# Patient Record
Sex: Male | Born: 1945 | Race: White | Hispanic: No | Marital: Married | Smoking: Former smoker
Health system: Southern US, Community
[De-identification: ages and names within clinical notes are randomized; demographics above are authoritative.]

## PROBLEM LIST (undated history)

## (undated) DIAGNOSIS — E785 Hyperlipidemia, unspecified: Secondary | ICD-10-CM

## (undated) DIAGNOSIS — J189 Pneumonia, unspecified organism: Secondary | ICD-10-CM

## (undated) DIAGNOSIS — I10 Essential (primary) hypertension: Secondary | ICD-10-CM

## (undated) DIAGNOSIS — M4722 Other spondylosis with radiculopathy, cervical region: Secondary | ICD-10-CM

## (undated) DIAGNOSIS — J3089 Other allergic rhinitis: Secondary | ICD-10-CM

## (undated) DIAGNOSIS — M1990 Unspecified osteoarthritis, unspecified site: Secondary | ICD-10-CM

## (undated) DIAGNOSIS — E119 Type 2 diabetes mellitus without complications: Secondary | ICD-10-CM

## (undated) DIAGNOSIS — Z87442 Personal history of urinary calculi: Secondary | ICD-10-CM

## (undated) DIAGNOSIS — I779 Disorder of arteries and arterioles, unspecified: Secondary | ICD-10-CM

## (undated) HISTORY — PX: KNEE ARTHROSCOPY: SHX127

## (undated) HISTORY — PX: CATARACT EXTRACTION: SUR2

## (undated) HISTORY — PX: EYE SURGERY: SHX253

## (undated) HISTORY — PX: COLONOSCOPY: SHX174

## (undated) HISTORY — PX: JOINT REPLACEMENT: SHX530

---

## 2011-07-29 ENCOUNTER — Ambulatory Visit: Payer: Self-pay | Admitting: Internal Medicine

## 2015-01-02 DIAGNOSIS — M25561 Pain in right knee: Secondary | ICD-10-CM | POA: Diagnosis not present

## 2015-01-02 DIAGNOSIS — M2391 Unspecified internal derangement of right knee: Secondary | ICD-10-CM | POA: Diagnosis not present

## 2015-02-25 ENCOUNTER — Ambulatory Visit: Payer: Self-pay | Admitting: General Practice

## 2015-02-25 DIAGNOSIS — M949 Disorder of cartilage, unspecified: Secondary | ICD-10-CM | POA: Diagnosis not present

## 2015-02-25 DIAGNOSIS — M238X1 Other internal derangements of right knee: Secondary | ICD-10-CM | POA: Diagnosis not present

## 2015-02-25 DIAGNOSIS — S83241A Other tear of medial meniscus, current injury, right knee, initial encounter: Secondary | ICD-10-CM | POA: Diagnosis not present

## 2015-02-26 DIAGNOSIS — M25512 Pain in left shoulder: Secondary | ICD-10-CM | POA: Diagnosis not present

## 2015-02-26 DIAGNOSIS — M75122 Complete rotator cuff tear or rupture of left shoulder, not specified as traumatic: Secondary | ICD-10-CM | POA: Diagnosis not present

## 2015-02-26 DIAGNOSIS — M65812 Other synovitis and tenosynovitis, left shoulder: Secondary | ICD-10-CM | POA: Diagnosis not present

## 2015-02-26 DIAGNOSIS — M542 Cervicalgia: Secondary | ICD-10-CM | POA: Diagnosis not present

## 2015-03-09 DIAGNOSIS — S83271A Complex tear of lateral meniscus, current injury, right knee, initial encounter: Secondary | ICD-10-CM | POA: Diagnosis not present

## 2015-03-09 DIAGNOSIS — S161XXA Strain of muscle, fascia and tendon at neck level, initial encounter: Secondary | ICD-10-CM | POA: Diagnosis not present

## 2015-03-09 DIAGNOSIS — S83231A Complex tear of medial meniscus, current injury, right knee, initial encounter: Secondary | ICD-10-CM | POA: Diagnosis not present

## 2015-03-09 DIAGNOSIS — M47812 Spondylosis without myelopathy or radiculopathy, cervical region: Secondary | ICD-10-CM | POA: Diagnosis not present

## 2015-04-15 ENCOUNTER — Ambulatory Visit
Admit: 2015-04-15 | Disposition: A | Discharge status: Home / Self Care | Payer: Self-pay | Attending: Surgery | Admitting: Surgery

## 2015-04-15 DIAGNOSIS — M25561 Pain in right knee: Secondary | ICD-10-CM | POA: Diagnosis not present

## 2015-04-15 DIAGNOSIS — M479 Spondylosis, unspecified: Secondary | ICD-10-CM | POA: Diagnosis not present

## 2015-04-15 DIAGNOSIS — E785 Hyperlipidemia, unspecified: Secondary | ICD-10-CM | POA: Diagnosis not present

## 2015-04-15 DIAGNOSIS — Z882 Allergy status to sulfonamides status: Secondary | ICD-10-CM | POA: Diagnosis not present

## 2015-04-15 DIAGNOSIS — Z7982 Long term (current) use of aspirin: Secondary | ICD-10-CM | POA: Diagnosis not present

## 2015-04-15 DIAGNOSIS — E119 Type 2 diabetes mellitus without complications: Secondary | ICD-10-CM | POA: Diagnosis not present

## 2015-04-15 DIAGNOSIS — Z79899 Other long term (current) drug therapy: Secondary | ICD-10-CM | POA: Diagnosis not present

## 2015-04-15 DIAGNOSIS — M23303 Other meniscus derangements, unspecified medial meniscus, right knee: Secondary | ICD-10-CM | POA: Diagnosis not present

## 2015-04-15 DIAGNOSIS — I10 Essential (primary) hypertension: Secondary | ICD-10-CM | POA: Diagnosis not present

## 2015-04-15 DIAGNOSIS — Z87891 Personal history of nicotine dependence: Secondary | ICD-10-CM | POA: Diagnosis not present

## 2015-04-15 DIAGNOSIS — M1711 Unilateral primary osteoarthritis, right knee: Secondary | ICD-10-CM | POA: Diagnosis not present

## 2015-04-15 DIAGNOSIS — M23203 Derangement of unspecified medial meniscus due to old tear or injury, right knee: Secondary | ICD-10-CM | POA: Diagnosis not present

## 2015-04-24 DIAGNOSIS — E782 Mixed hyperlipidemia: Secondary | ICD-10-CM | POA: Diagnosis not present

## 2015-04-24 DIAGNOSIS — R030 Elevated blood-pressure reading, without diagnosis of hypertension: Secondary | ICD-10-CM | POA: Diagnosis not present

## 2015-04-24 DIAGNOSIS — E119 Type 2 diabetes mellitus without complications: Secondary | ICD-10-CM | POA: Diagnosis not present

## 2015-04-29 DIAGNOSIS — E119 Type 2 diabetes mellitus without complications: Secondary | ICD-10-CM | POA: Diagnosis not present

## 2015-04-29 DIAGNOSIS — E785 Hyperlipidemia, unspecified: Secondary | ICD-10-CM | POA: Diagnosis not present

## 2015-04-29 DIAGNOSIS — E1169 Type 2 diabetes mellitus with other specified complication: Secondary | ICD-10-CM | POA: Diagnosis not present

## 2015-04-29 DIAGNOSIS — I10 Essential (primary) hypertension: Secondary | ICD-10-CM | POA: Diagnosis not present

## 2015-09-07 DIAGNOSIS — M7051 Other bursitis of knee, right knee: Secondary | ICD-10-CM | POA: Diagnosis not present

## 2015-09-07 DIAGNOSIS — M1711 Unilateral primary osteoarthritis, right knee: Secondary | ICD-10-CM | POA: Diagnosis not present

## 2015-10-30 DIAGNOSIS — E1169 Type 2 diabetes mellitus with other specified complication: Secondary | ICD-10-CM | POA: Diagnosis not present

## 2015-10-30 DIAGNOSIS — E785 Hyperlipidemia, unspecified: Secondary | ICD-10-CM | POA: Diagnosis not present

## 2015-10-30 DIAGNOSIS — E119 Type 2 diabetes mellitus without complications: Secondary | ICD-10-CM | POA: Diagnosis not present

## 2015-11-03 DIAGNOSIS — E1169 Type 2 diabetes mellitus with other specified complication: Secondary | ICD-10-CM | POA: Diagnosis not present

## 2015-11-03 DIAGNOSIS — E119 Type 2 diabetes mellitus without complications: Secondary | ICD-10-CM | POA: Diagnosis not present

## 2015-11-03 DIAGNOSIS — Z0000 Encounter for general adult medical examination without abnormal findings: Secondary | ICD-10-CM | POA: Diagnosis not present

## 2015-11-03 DIAGNOSIS — E785 Hyperlipidemia, unspecified: Secondary | ICD-10-CM | POA: Diagnosis not present

## 2015-11-03 DIAGNOSIS — I10 Essential (primary) hypertension: Secondary | ICD-10-CM | POA: Diagnosis not present

## 2015-12-24 DIAGNOSIS — E119 Type 2 diabetes mellitus without complications: Secondary | ICD-10-CM | POA: Diagnosis not present

## 2016-04-29 DIAGNOSIS — Z0000 Encounter for general adult medical examination without abnormal findings: Secondary | ICD-10-CM | POA: Diagnosis not present

## 2016-04-29 DIAGNOSIS — E119 Type 2 diabetes mellitus without complications: Secondary | ICD-10-CM | POA: Diagnosis not present

## 2016-04-29 DIAGNOSIS — E785 Hyperlipidemia, unspecified: Secondary | ICD-10-CM | POA: Diagnosis not present

## 2016-04-29 DIAGNOSIS — E1169 Type 2 diabetes mellitus with other specified complication: Secondary | ICD-10-CM | POA: Diagnosis not present

## 2016-05-06 DIAGNOSIS — E119 Type 2 diabetes mellitus without complications: Secondary | ICD-10-CM | POA: Diagnosis not present

## 2016-05-06 DIAGNOSIS — I779 Disorder of arteries and arterioles, unspecified: Secondary | ICD-10-CM | POA: Diagnosis not present

## 2016-05-06 DIAGNOSIS — E1169 Type 2 diabetes mellitus with other specified complication: Secondary | ICD-10-CM | POA: Diagnosis not present

## 2016-05-06 DIAGNOSIS — E785 Hyperlipidemia, unspecified: Secondary | ICD-10-CM | POA: Diagnosis not present

## 2016-05-06 DIAGNOSIS — I10 Essential (primary) hypertension: Secondary | ICD-10-CM | POA: Diagnosis not present

## 2016-05-13 DIAGNOSIS — M1711 Unilateral primary osteoarthritis, right knee: Secondary | ICD-10-CM | POA: Diagnosis not present

## 2016-05-13 DIAGNOSIS — S83271D Complex tear of lateral meniscus, current injury, right knee, subsequent encounter: Secondary | ICD-10-CM | POA: Diagnosis not present

## 2016-05-13 DIAGNOSIS — S83231D Complex tear of medial meniscus, current injury, right knee, subsequent encounter: Secondary | ICD-10-CM | POA: Diagnosis not present

## 2016-08-19 DIAGNOSIS — G8929 Other chronic pain: Secondary | ICD-10-CM | POA: Diagnosis not present

## 2016-08-19 DIAGNOSIS — M1711 Unilateral primary osteoarthritis, right knee: Secondary | ICD-10-CM | POA: Diagnosis not present

## 2016-08-19 DIAGNOSIS — M25561 Pain in right knee: Secondary | ICD-10-CM | POA: Diagnosis not present

## 2016-08-26 DIAGNOSIS — M1711 Unilateral primary osteoarthritis, right knee: Secondary | ICD-10-CM | POA: Diagnosis not present

## 2016-08-26 DIAGNOSIS — G8929 Other chronic pain: Secondary | ICD-10-CM | POA: Diagnosis not present

## 2016-08-26 DIAGNOSIS — M25561 Pain in right knee: Secondary | ICD-10-CM | POA: Diagnosis not present

## 2016-09-02 DIAGNOSIS — M1711 Unilateral primary osteoarthritis, right knee: Secondary | ICD-10-CM | POA: Diagnosis not present

## 2016-10-24 DIAGNOSIS — G8929 Other chronic pain: Secondary | ICD-10-CM | POA: Diagnosis not present

## 2016-10-24 DIAGNOSIS — M1711 Unilateral primary osteoarthritis, right knee: Secondary | ICD-10-CM | POA: Diagnosis not present

## 2016-10-24 DIAGNOSIS — M25561 Pain in right knee: Secondary | ICD-10-CM | POA: Diagnosis not present

## 2016-11-08 DIAGNOSIS — E1169 Type 2 diabetes mellitus with other specified complication: Secondary | ICD-10-CM | POA: Diagnosis not present

## 2016-11-08 DIAGNOSIS — E119 Type 2 diabetes mellitus without complications: Secondary | ICD-10-CM | POA: Diagnosis not present

## 2016-11-08 DIAGNOSIS — E785 Hyperlipidemia, unspecified: Secondary | ICD-10-CM | POA: Diagnosis not present

## 2016-11-14 DIAGNOSIS — Z0000 Encounter for general adult medical examination without abnormal findings: Secondary | ICD-10-CM | POA: Diagnosis not present

## 2016-11-14 DIAGNOSIS — E785 Hyperlipidemia, unspecified: Secondary | ICD-10-CM | POA: Diagnosis not present

## 2016-11-14 DIAGNOSIS — E119 Type 2 diabetes mellitus without complications: Secondary | ICD-10-CM | POA: Diagnosis not present

## 2016-11-14 DIAGNOSIS — I10 Essential (primary) hypertension: Secondary | ICD-10-CM | POA: Diagnosis not present

## 2016-11-14 DIAGNOSIS — I779 Disorder of arteries and arterioles, unspecified: Secondary | ICD-10-CM | POA: Diagnosis not present

## 2016-11-14 DIAGNOSIS — E1169 Type 2 diabetes mellitus with other specified complication: Secondary | ICD-10-CM | POA: Diagnosis not present

## 2016-12-14 ENCOUNTER — Other Ambulatory Visit: Payer: Self-pay | Admitting: Surgery

## 2016-12-14 DIAGNOSIS — S161XXA Strain of muscle, fascia and tendon at neck level, initial encounter: Secondary | ICD-10-CM | POA: Diagnosis not present

## 2016-12-14 DIAGNOSIS — M4802 Spinal stenosis, cervical region: Secondary | ICD-10-CM

## 2016-12-14 DIAGNOSIS — M47812 Spondylosis without myelopathy or radiculopathy, cervical region: Secondary | ICD-10-CM

## 2016-12-14 DIAGNOSIS — M25511 Pain in right shoulder: Secondary | ICD-10-CM | POA: Diagnosis not present

## 2016-12-15 ENCOUNTER — Ambulatory Visit
Admission: RE | Admit: 2016-12-15 | Discharge: 2016-12-15 | Disposition: A | Discharge status: Home / Self Care | Payer: Commercial Managed Care - HMO | Source: Ambulatory Visit | Attending: Surgery | Admitting: Surgery

## 2016-12-15 DIAGNOSIS — M47812 Spondylosis without myelopathy or radiculopathy, cervical region: Secondary | ICD-10-CM | POA: Diagnosis not present

## 2016-12-15 DIAGNOSIS — X58XXXA Exposure to other specified factors, initial encounter: Secondary | ICD-10-CM | POA: Diagnosis not present

## 2016-12-15 DIAGNOSIS — S161XXA Strain of muscle, fascia and tendon at neck level, initial encounter: Secondary | ICD-10-CM | POA: Diagnosis not present

## 2016-12-15 DIAGNOSIS — M4802 Spinal stenosis, cervical region: Secondary | ICD-10-CM

## 2016-12-15 DIAGNOSIS — M1288 Other specific arthropathies, not elsewhere classified, other specified site: Secondary | ICD-10-CM | POA: Insufficient documentation

## 2016-12-15 DIAGNOSIS — M5126 Other intervertebral disc displacement, lumbar region: Secondary | ICD-10-CM | POA: Diagnosis not present

## 2018-01-31 ENCOUNTER — Ambulatory Visit
Admission: RE | Admit: 2018-01-31 | Discharge: 2018-01-31 | Disposition: A | Discharge status: Home / Self Care | Payer: Medicare PPO | Source: Ambulatory Visit | Attending: Surgery | Admitting: Surgery

## 2018-01-31 ENCOUNTER — Encounter
Admission: RE | Admit: 2018-01-31 | Discharge: 2018-01-31 | Disposition: A | Discharge status: Home / Self Care | Payer: Medicare PPO | Source: Ambulatory Visit | Attending: Surgery | Admitting: Surgery

## 2018-01-31 ENCOUNTER — Other Ambulatory Visit: Payer: Self-pay

## 2018-01-31 DIAGNOSIS — I10 Essential (primary) hypertension: Secondary | ICD-10-CM | POA: Diagnosis not present

## 2018-01-31 DIAGNOSIS — E119 Type 2 diabetes mellitus without complications: Secondary | ICD-10-CM | POA: Diagnosis not present

## 2018-01-31 DIAGNOSIS — Z01818 Encounter for other preprocedural examination: Secondary | ICD-10-CM

## 2018-01-31 DIAGNOSIS — Z01812 Encounter for preprocedural laboratory examination: Secondary | ICD-10-CM | POA: Diagnosis present

## 2018-01-31 HISTORY — DX: Hyperlipidemia, unspecified: E78.5

## 2018-01-31 HISTORY — DX: Unspecified osteoarthritis, unspecified site: M19.90

## 2018-01-31 HISTORY — DX: Type 2 diabetes mellitus without complications: E11.9

## 2018-01-31 HISTORY — DX: Essential (primary) hypertension: I10

## 2018-01-31 HISTORY — DX: Other allergic rhinitis: J30.89

## 2018-01-31 LAB — URINALYSIS, COMPLETE (UACMP) WITH MICROSCOPIC
Bilirubin Urine: NEGATIVE
GLUCOSE, UA: NEGATIVE mg/dL
HGB URINE DIPSTICK: NEGATIVE
Ketones, ur: NEGATIVE mg/dL
NITRITE: POSITIVE — AB
PH: 5 (ref 5.0–8.0)
Protein, ur: NEGATIVE mg/dL
Specific Gravity, Urine: 1.019 (ref 1.005–1.030)
Squamous Epithelial / LPF: NONE SEEN

## 2018-01-31 LAB — BASIC METABOLIC PANEL
Anion gap: 10 (ref 5–15)
BUN: 18 mg/dL (ref 6–20)
CHLORIDE: 104 mmol/L (ref 101–111)
CO2: 24 mmol/L (ref 22–32)
Calcium: 9.5 mg/dL (ref 8.9–10.3)
Creatinine, Ser: 0.95 mg/dL (ref 0.61–1.24)
GFR calc Af Amer: >60 mL/min (ref >60)
GFR calc non Af Amer: >60 mL/min (ref >60)
GLUCOSE: 123 mg/dL — AB (ref 65–99)
POTASSIUM: 4.2 mmol/L (ref 3.5–5.1)
Sodium: 138 mmol/L (ref 135–145)

## 2018-01-31 LAB — PROTIME-INR
INR: 1.04
PROTHROMBIN TIME: 13.5 s (ref 11.4–15.2)

## 2018-01-31 LAB — SURGICAL PCR SCREEN
MRSA, PCR: NEGATIVE
Staphylococcus aureus: POSITIVE — AB

## 2018-01-31 LAB — TYPE AND SCREEN
ABO/RH(D): O NEG
ANTIBODY SCREEN: NEGATIVE

## 2018-01-31 LAB — CBC
HCT: 42.9 % (ref 40.0–52.0)
HEMOGLOBIN: 14.5 g/dL (ref 13.0–18.0)
MCH: 30.9 pg (ref 26.0–34.0)
MCHC: 33.9 g/dL (ref 32.0–36.0)
MCV: 91.1 fL (ref 80.0–100.0)
Platelets: 266 10*3/uL (ref 150–440)
RBC: 4.71 MIL/uL (ref 4.40–5.90)
RDW: 13.4 % (ref 11.5–14.5)
WBC: 7.3 10*3/uL (ref 3.8–10.6)

## 2018-01-31 NOTE — Patient Instructions (Signed)
Your procedure is scheduled on: 02/08/18 Thurs Report to Same Day Surgery 2nd floor medical mall [facility] Entrance-take elevator on left to 2nd floor.  Check in with surgery information desk.) To find out your arrival time please call [phone] between 1PM - 3PM on 02/07/18 Wed  Remember: Instructions that are not followed completely may result in serious medical risk, up to and including death, or upon the discretion of your surgeon and anesthesiologist your surgery may need to be rescheduled.    _x___ 1. Do not eat food after midnight the night before your procedure. You may drink clear liquids up to 2 hours before you are scheduled to arrive at the hospital for your procedure.  Do not drink clear liquids within 2 hours of your scheduled arrival to the hospital.  Clear liquids include  --Water or Apple juice without pulp  --Clear carbohydrate beverage such as ClearFast or Gatorade  --Black Coffee or Clear Tea (No milk, no creamers, do not add anything to                  the coffee or Tea Type 1 and type 2 diabetics should only drink water.  No gum chewing or hard candies.     __x__ 2. No Alcohol for 24 hours before or after surgery.   __x__3. No Smoking or e-cigarettes for 24 prior to surgery.  Do not use any chewable tobacco products for at least 6 hour prior to surgery   ____  4. Bring all medications with you on the day of surgery if instructed.    __x__ 5. Notify your doctor if there is any change in your medical condition     (cold, fever, infections).    x___6. On the morning of surgery brush your teeth with toothpaste and water.  You may rinse your mouth with mouth wash if you wish.  Do not swallow any toothpaste or mouthwash.   Do not wear jewelry, make-up, hairpins, clips or nail polish.  Do not wear lotions, powders, or perfumes. You may wear deodorant.  Do not shave 48 hours prior to surgery. Men may shave face and neck.  Do not bring valuables to the hospital.     [facility] is not responsible for any belongings or valuables.               Contacts, dentures or bridgework may not be worn into surgery.  Leave your suitcase in the car. After surgery it may be brought to your room.  For patients admitted to the hospital, discharge time is determined by your                       treatment team.  _  Patients discharged the day of surgery will not be allowed to drive home.  You will need someone to drive you home and stay with you the night of your procedure.    Please read over the following fact sheets that you were given:   [facility] Preparing for Surgery and or MRSA Information   _x___ Take anti-hypertensive listed below, cardiac, seizure, asthma,     anti-reflux and psychiatric medicines. These include:  1. fluticasone (FLONASE) 50 MCG/ACT nasal spray  2.  3.  4.  5.  6.  ____Fleets enema or Magnesium Citrate as directed.   _x___ Use CHG Soap or sage wipes as directed on instruction sheet   ____ Use inhalers on the day of surgery and bring to  hospital day of surgery  _x___ Stop Metformin and Janumet 2 days prior to surgery.    ____ Take 1/2 of usual insulin dose the night before surgery and none on the morning     surgery.   _x___ Follow recommendations from Cardiologist, Pulmonologist or PCP regarding          stopping Aspirin, Coumadin, Plavix ,Eliquis, Effient, or Pradaxa, and Pletal.  X____Stop Anti-inflammatories such as Advil, Aleve, Ibuprofen, Motrin, Naproxen, Naprosyn, Goodies powders or aspirin products. OK to take Tylenol and                          Celebrex.   _x___ Stop supplements until after surgery.  But may continue Vitamin D, Vitamin B,       and multivitamin.   ____ Bring C-Pap to the hospital.

## 2018-02-07 MED ORDER — CEFAZOLIN SODIUM-DEXTROSE 2-4 GM/100ML-% IV SOLN: 2.0000 g | Freq: Once | INTRAVENOUS | Status: DC

## 2018-02-08 ENCOUNTER — Inpatient Hospital Stay: Payer: Medicare PPO

## 2018-02-08 ENCOUNTER — Other Ambulatory Visit: Payer: Self-pay

## 2018-02-08 ENCOUNTER — Inpatient Hospital Stay
Admission: RE | Admit: 2018-02-08 | Discharge: 2018-02-09 | DRG: 470 | Disposition: A | Discharge status: Home Health | Payer: Medicare PPO | Source: Ambulatory Visit | Attending: Surgery | Admitting: Surgery

## 2018-02-08 ENCOUNTER — Encounter
Admission: RE | Disposition: A | Discharge status: Home Health | Payer: Self-pay | Source: Ambulatory Visit | Attending: Surgery

## 2018-02-08 ENCOUNTER — Inpatient Hospital Stay: Payer: Medicare PPO | Admitting: Anesthesiology

## 2018-02-08 DIAGNOSIS — M1711 Unilateral primary osteoarthritis, right knee: Secondary | ICD-10-CM | POA: Diagnosis present

## 2018-02-08 DIAGNOSIS — Z7984 Long term (current) use of oral hypoglycemic drugs: Secondary | ICD-10-CM | POA: Diagnosis not present

## 2018-02-08 DIAGNOSIS — Z96651 Presence of right artificial knee joint: Secondary | ICD-10-CM

## 2018-02-08 DIAGNOSIS — Z87891 Personal history of nicotine dependence: Secondary | ICD-10-CM | POA: Diagnosis not present

## 2018-02-08 DIAGNOSIS — E119 Type 2 diabetes mellitus without complications: Secondary | ICD-10-CM | POA: Diagnosis present

## 2018-02-08 DIAGNOSIS — I10 Essential (primary) hypertension: Secondary | ICD-10-CM | POA: Diagnosis present

## 2018-02-08 DIAGNOSIS — Z7982 Long term (current) use of aspirin: Secondary | ICD-10-CM | POA: Diagnosis not present

## 2018-02-08 HISTORY — PX: TOTAL KNEE ARTHROPLASTY: SHX125

## 2018-02-08 LAB — ABO/RH: ABO/RH(D): O NEG

## 2018-02-08 LAB — GLUCOSE, CAPILLARY
GLUCOSE-CAPILLARY: 91 mg/dL (ref 65–99)
GLUCOSE-CAPILLARY: 123 mg/dL — AB (ref 65–99)
Glucose-Capillary: 104 mg/dL — ABNORMAL HIGH (ref 65–99)

## 2018-02-08 SURGERY — ARTHROPLASTY, KNEE, TOTAL
Anesthesia: Spinal | Site: Knee | Laterality: Right | Wound class: Clean

## 2018-02-08 MED ORDER — SODIUM CHLORIDE 0.9 % IV SOLN
INTRAVENOUS | Status: DC
  Administered 2018-02-08: 10:00:00 via INTRAVENOUS

## 2018-02-08 MED ORDER — ACETAMINOPHEN 325 MG PO TABS: 650.0000 mg | ORAL_TABLET | ORAL | Status: DC | PRN

## 2018-02-08 MED ORDER — MORPHINE SULFATE (PF) 2 MG/ML IV SOLN: 2.0000 mg | INTRAVENOUS | Status: DC | PRN

## 2018-02-08 MED ORDER — ASPIRIN EC 81 MG PO TBEC
81.0000 mg | DELAYED_RELEASE_TABLET | Freq: Every day | ORAL | Status: DC
  Administered 2018-02-09: 81 mg via ORAL
  Filled 2018-02-08: qty 1

## 2018-02-08 MED ORDER — FAMOTIDINE 20 MG PO TABS
ORAL_TABLET | ORAL | Status: AC
  Filled 2018-02-08: qty 1

## 2018-02-08 MED ORDER — DIPHENHYDRAMINE HCL 12.5 MG/5ML PO ELIX: 12.5000 mg | ORAL_SOLUTION | ORAL | Status: DC | PRN

## 2018-02-08 MED ORDER — NEOMYCIN-POLYMYXIN B GU 40-200000 IR SOLN
Status: AC
  Filled 2018-02-08: qty 20

## 2018-02-08 MED ORDER — ACETAMINOPHEN 650 MG RE SUPP: 650.0000 mg | RECTAL | Status: DC | PRN

## 2018-02-08 MED ORDER — PROMETHAZINE HCL 25 MG/ML IJ SOLN: 6.2500 mg | INTRAMUSCULAR | Status: DC | PRN

## 2018-02-08 MED ORDER — FENTANYL CITRATE (PF) 100 MCG/2ML IJ SOLN
INTRAMUSCULAR | Status: AC
  Filled 2018-02-08: qty 2

## 2018-02-08 MED ORDER — CEFAZOLIN SODIUM-DEXTROSE 2-3 GM-%(50ML) IV SOLR
INTRAVENOUS | Status: DC | PRN
  Administered 2018-02-08: 2 g via INTRAVENOUS

## 2018-02-08 MED ORDER — FENTANYL CITRATE (PF) 100 MCG/2ML IJ SOLN
INTRAMUSCULAR | Status: DC | PRN
  Administered 2018-02-08 (×2): 50 ug via INTRAVENOUS

## 2018-02-08 MED ORDER — DOCUSATE SODIUM 100 MG PO CAPS
100.0000 mg | ORAL_CAPSULE | Freq: Two times a day (BID) | ORAL | Status: DC
  Administered 2018-02-08 – 2018-02-09 (×2): 100 mg via ORAL
  Filled 2018-02-08 (×2): qty 1

## 2018-02-08 MED ORDER — MAGNESIUM HYDROXIDE 400 MG/5ML PO SUSP: 30.0000 mL | Freq: Every day | ORAL | Status: DC | PRN

## 2018-02-08 MED ORDER — ACETAMINOPHEN 10 MG/ML IV SOLN
INTRAVENOUS | Status: DC | PRN
  Administered 2018-02-08: 1000 mg via INTRAVENOUS

## 2018-02-08 MED ORDER — GLYCOPYRROLATE 0.2 MG/ML IJ SOLN
INTRAMUSCULAR | Status: DC | PRN
  Administered 2018-02-08: 0.2 mg via INTRAVENOUS

## 2018-02-08 MED ORDER — FAMOTIDINE 20 MG PO TABS
20.0000 mg | ORAL_TABLET | Freq: Once | ORAL | Status: AC
  Administered 2018-02-08: 20 mg via ORAL

## 2018-02-08 MED ORDER — FLEET ENEMA 7-19 GM/118ML RE ENEM: 1.0000 | ENEMA | Freq: Once | RECTAL | Status: DC | PRN

## 2018-02-08 MED ORDER — ACETAMINOPHEN 500 MG PO TABS
1000.0000 mg | ORAL_TABLET | Freq: Four times a day (QID) | ORAL | Status: AC
  Administered 2018-02-08 – 2018-02-09 (×3): 1000 mg via ORAL
  Filled 2018-02-08 (×4): qty 2

## 2018-02-08 MED ORDER — LIDOCAINE HCL (PF) 2 % IJ SOLN
INTRAMUSCULAR | Status: AC
  Filled 2018-02-08: qty 10

## 2018-02-08 MED ORDER — NEOMYCIN-POLYMYXIN B GU 40-200000 IR SOLN
Status: DC | PRN
  Administered 2018-02-08: 14 mL

## 2018-02-08 MED ORDER — ONDANSETRON HCL 4 MG/2ML IJ SOLN
INTRAMUSCULAR | Status: AC
  Filled 2018-02-08: qty 2

## 2018-02-08 MED ORDER — SODIUM CHLORIDE 0.9 % IV SOLN
INTRAVENOUS | Status: DC | PRN
  Administered 2018-02-08: 60 mL

## 2018-02-08 MED ORDER — CEFAZOLIN SODIUM-DEXTROSE 2-4 GM/100ML-% IV SOLN
INTRAVENOUS | Status: AC
  Filled 2018-02-08: qty 100

## 2018-02-08 MED ORDER — KETOROLAC TROMETHAMINE 15 MG/ML IJ SOLN
INTRAMUSCULAR | Status: AC
Start: 2018-02-08 — End: 2018-02-09
  Filled 2018-02-08: qty 1

## 2018-02-08 MED ORDER — MIDAZOLAM HCL 2 MG/2ML IJ SOLN
INTRAMUSCULAR | Status: AC
  Filled 2018-02-08: qty 2

## 2018-02-08 MED ORDER — BUPIVACAINE-EPINEPHRINE (PF) 0.5% -1:200000 IJ SOLN
INTRAMUSCULAR | Status: AC
  Filled 2018-02-08: qty 30

## 2018-02-08 MED ORDER — PROPOFOL 500 MG/50ML IV EMUL
INTRAVENOUS | Status: AC
  Filled 2018-02-08: qty 50

## 2018-02-08 MED ORDER — ATORVASTATIN CALCIUM 20 MG PO TABS
80.0000 mg | ORAL_TABLET | Freq: Every day | ORAL | Status: DC
  Administered 2018-02-09: 80 mg via ORAL
  Filled 2018-02-08: qty 4

## 2018-02-08 MED ORDER — PHENYLEPHRINE HCL 1 % NA SOLN
1.0000 [drp] | Freq: Two times a day (BID) | NASAL | Status: DC | PRN
  Filled 2018-02-08: qty 15

## 2018-02-08 MED ORDER — OXYCODONE HCL 5 MG PO TABS: 10.0000 mg | ORAL_TABLET | ORAL | Status: DC | PRN

## 2018-02-08 MED ORDER — KETAMINE HCL 50 MG/ML IJ SOLN
INTRAMUSCULAR | Status: AC
  Filled 2018-02-08: qty 10

## 2018-02-08 MED ORDER — HYDROCODONE-ACETAMINOPHEN 7.5-325 MG PO TABS
1.0000 | ORAL_TABLET | Freq: Once | ORAL | Status: DC | PRN
  Filled 2018-02-08: qty 1

## 2018-02-08 MED ORDER — MIDAZOLAM HCL 5 MG/5ML IJ SOLN
INTRAMUSCULAR | Status: DC | PRN
  Administered 2018-02-08 (×4): 1 mg via INTRAVENOUS

## 2018-02-08 MED ORDER — POTASSIUM CHLORIDE IN NACL 20-0.9 MEQ/L-% IV SOLN
INTRAVENOUS | Status: DC
  Administered 2018-02-08: 16:00:00 via INTRAVENOUS
  Filled 2018-02-08 (×5): qty 1000

## 2018-02-08 MED ORDER — ONDANSETRON HCL 4 MG PO TABS: 4.0000 mg | ORAL_TABLET | Freq: Four times a day (QID) | ORAL | Status: DC | PRN

## 2018-02-08 MED ORDER — METOCLOPRAMIDE HCL 10 MG PO TABS: 5.0000 mg | ORAL_TABLET | Freq: Three times a day (TID) | ORAL | Status: DC | PRN

## 2018-02-08 MED ORDER — FAMOTIDINE 20 MG PO TABS
ORAL_TABLET | ORAL | Status: AC
  Administered 2018-02-08: 20 mg via ORAL
  Filled 2018-02-08: qty 1

## 2018-02-08 MED ORDER — METFORMIN HCL 500 MG PO TABS
500.0000 mg | ORAL_TABLET | Freq: Every day | ORAL | Status: DC
  Administered 2018-02-08: 500 mg via ORAL
  Filled 2018-02-08 (×2): qty 1

## 2018-02-08 MED ORDER — ENOXAPARIN SODIUM 40 MG/0.4ML ~~LOC~~ SOLN
40.0000 mg | SUBCUTANEOUS | Status: DC
  Administered 2018-02-09: 40 mg via SUBCUTANEOUS
  Filled 2018-02-08: qty 0.4

## 2018-02-08 MED ORDER — CEFAZOLIN SODIUM-DEXTROSE 2-4 GM/100ML-% IV SOLN
2.0000 g | Freq: Four times a day (QID) | INTRAVENOUS | Status: AC
  Administered 2018-02-09 (×2): 2 g via INTRAVENOUS
  Filled 2018-02-08 (×3): qty 100

## 2018-02-08 MED ORDER — SODIUM CHLORIDE 0.9 % IJ SOLN
INTRAMUSCULAR | Status: DC | PRN
  Administered 2018-02-08: 40 mL via INTRAVENOUS

## 2018-02-08 MED ORDER — BUPIVACAINE HCL (PF) 0.5 % IJ SOLN
INTRAMUSCULAR | Status: DC | PRN
  Administered 2018-02-08: 3 mL

## 2018-02-08 MED ORDER — KETOROLAC TROMETHAMINE 15 MG/ML IJ SOLN
7.5000 mg | Freq: Four times a day (QID) | INTRAMUSCULAR | Status: AC
  Administered 2018-02-08 – 2018-02-09 (×4): 7.5 mg via INTRAVENOUS
  Filled 2018-02-08 (×4): qty 1

## 2018-02-08 MED ORDER — MEPERIDINE HCL 50 MG/ML IJ SOLN: 6.2500 mg | INTRAMUSCULAR | Status: DC | PRN

## 2018-02-08 MED ORDER — BISACODYL 10 MG RE SUPP: 10.0000 mg | Freq: Every day | RECTAL | Status: DC | PRN

## 2018-02-08 MED ORDER — TRANEXAMIC ACID 1000 MG/10ML IV SOLN
INTRAVENOUS | Status: AC
  Filled 2018-02-08: qty 10

## 2018-02-08 MED ORDER — ADULT MULTIVITAMIN W/MINERALS CH
1.0000 | ORAL_TABLET | Freq: Every day | ORAL | Status: DC
  Administered 2018-02-09: 1 via ORAL
  Filled 2018-02-08: qty 1

## 2018-02-08 MED ORDER — LACTATED RINGERS IV SOLN
INTRAVENOUS | Status: DC | PRN
  Administered 2018-02-08 (×2): via INTRAVENOUS

## 2018-02-08 MED ORDER — ENALAPRIL MALEATE 10 MG PO TABS
10.0000 mg | ORAL_TABLET | Freq: Every day | ORAL | Status: DC
  Administered 2018-02-09: 10 mg via ORAL
  Filled 2018-02-08 (×2): qty 1

## 2018-02-08 MED ORDER — SODIUM CHLORIDE 0.9 % IJ SOLN
INTRAMUSCULAR | Status: AC
  Filled 2018-02-08: qty 50

## 2018-02-08 MED ORDER — FLUTICASONE PROPIONATE 50 MCG/ACT NA SUSP
1.0000 | Freq: Every day | NASAL | Status: DC | PRN
  Filled 2018-02-08: qty 16

## 2018-02-08 MED ORDER — ONDANSETRON HCL 4 MG/2ML IJ SOLN
INTRAMUSCULAR | Status: DC | PRN
  Administered 2018-02-08: 4 mg via INTRAVENOUS

## 2018-02-08 MED ORDER — ACETAMINOPHEN 10 MG/ML IV SOLN
INTRAVENOUS | Status: AC
  Filled 2018-02-08: qty 100

## 2018-02-08 MED ORDER — GLYCOPYRROLATE 0.2 MG/ML IJ SOLN
INTRAMUSCULAR | Status: AC
  Filled 2018-02-08: qty 1

## 2018-02-08 MED ORDER — KETAMINE HCL 10 MG/ML IJ SOLN
INTRAMUSCULAR | Status: DC | PRN
Start: 2018-02-08 — End: 2018-02-08
  Administered 2018-02-08 (×5): 10 mg via INTRAVENOUS

## 2018-02-08 MED ORDER — PANTOPRAZOLE SODIUM 40 MG PO TBEC
40.0000 mg | DELAYED_RELEASE_TABLET | Freq: Every day | ORAL | Status: DC
  Administered 2018-02-09: 40 mg via ORAL
  Filled 2018-02-08: qty 1

## 2018-02-08 MED ORDER — BUPIVACAINE-EPINEPHRINE (PF) 0.5% -1:200000 IJ SOLN
INTRAMUSCULAR | Status: DC | PRN
  Administered 2018-02-08: 30 mL via PERINEURAL

## 2018-02-08 MED ORDER — KETOROLAC TROMETHAMINE 15 MG/ML IJ SOLN
15.0000 mg | Freq: Once | INTRAMUSCULAR | Status: AC
  Administered 2018-02-08: 15 mg via INTRAVENOUS

## 2018-02-08 MED ORDER — METOCLOPRAMIDE HCL 5 MG/ML IJ SOLN: 5.0000 mg | Freq: Three times a day (TID) | INTRAMUSCULAR | Status: DC | PRN

## 2018-02-08 MED ORDER — OXYCODONE HCL 5 MG PO TABS: 5.0000 mg | ORAL_TABLET | ORAL | Status: DC | PRN

## 2018-02-08 MED ORDER — HYDROMORPHONE HCL 1 MG/ML IJ SOLN: 0.2500 mg | INTRAMUSCULAR | Status: DC | PRN

## 2018-02-08 MED ORDER — TRANEXAMIC ACID 1000 MG/10ML IV SOLN
INTRAVENOUS | Status: DC | PRN
  Administered 2018-02-08: 1000 mg via TOPICAL

## 2018-02-08 MED ORDER — BUPIVACAINE LIPOSOME 1.3 % IJ SUSP
INTRAMUSCULAR | Status: AC
  Filled 2018-02-08: qty 20

## 2018-02-08 MED ORDER — ONDANSETRON HCL 4 MG/2ML IJ SOLN: 4.0000 mg | Freq: Four times a day (QID) | INTRAMUSCULAR | Status: DC | PRN

## 2018-02-08 MED ORDER — PROPOFOL 500 MG/50ML IV EMUL
INTRAVENOUS | Status: DC | PRN
  Administered 2018-02-08: 100 ug/kg/min via INTRAVENOUS

## 2018-02-08 SURGICAL SUPPLY — 55 items
BANDAGE ELASTIC 6 LF NS (GAUZE/BANDAGES/DRESSINGS) ×3 IMPLANT
BLADE SAW SAG 25X90X1.19 (BLADE) ×3 IMPLANT
BLADE SURG SZ20 CARB STEEL (BLADE) ×3 IMPLANT
CANISTER SUCT 1200ML W/VALVE (MISCELLANEOUS) ×3 IMPLANT
CANISTER SUCT 3000ML PPV (MISCELLANEOUS) ×3 IMPLANT
CAPT KNEE TOTAL 3 ×3 IMPLANT
CEMENT BONE R 1X40 (Cement) ×66 IMPLANT
CEMENT VACUUM MIXING SYSTEM (MISCELLANEOUS) ×3 IMPLANT
CHLORAPREP W/TINT 26ML (MISCELLANEOUS) ×3 IMPLANT
COOLER POLAR GLACIER W/PUMP (MISCELLANEOUS) ×3 IMPLANT
COVER MAYO STAND STRL (DRAPES) ×3 IMPLANT
CUFF TOURN 24 STER (MISCELLANEOUS) IMPLANT
CUFF TOURN 30 STER DUAL PORT (MISCELLANEOUS) ×3 IMPLANT
DRAPE IMP U-DRAPE 54X76 (DRAPES) ×3 IMPLANT
DRAPE INCISE IOBAN 66X45 STRL (DRAPES) ×3 IMPLANT
DRAPE SHEET LG 3/4 BI-LAMINATE (DRAPES) ×3 IMPLANT
DRSG OPSITE POSTOP 4X12 (GAUZE/BANDAGES/DRESSINGS) IMPLANT
DRSG OPSITE POSTOP 4X14 (GAUZE/BANDAGES/DRESSINGS) IMPLANT
ELECT CAUTERY BLADE 6.4 (BLADE) ×3 IMPLANT
ELECT REM PT RETURN 9FT ADLT (ELECTROSURGICAL) ×3
ELECTRODE REM PT RTRN 9FT ADLT (ELECTROSURGICAL) ×1 IMPLANT
GLOVE BIO SURGEON STRL SZ7.5 (GLOVE) ×12 IMPLANT
GLOVE BIO SURGEON STRL SZ8 (GLOVE) ×12 IMPLANT
GLOVE BIOGEL PI IND STRL 8 (GLOVE) ×1 IMPLANT
GLOVE BIOGEL PI INDICATOR 8 (GLOVE) ×2
GLOVE INDICATOR 8.0 STRL GRN (GLOVE) ×3 IMPLANT
GOWN STRL REUS W/ TWL LRG LVL3 (GOWN DISPOSABLE) ×1 IMPLANT
GOWN STRL REUS W/ TWL XL LVL3 (GOWN DISPOSABLE) ×1 IMPLANT
GOWN STRL REUS W/TWL LRG LVL3 (GOWN DISPOSABLE) ×2
GOWN STRL REUS W/TWL XL LVL3 (GOWN DISPOSABLE) ×2
HOLDER FOLEY CATH W/STRAP (MISCELLANEOUS) ×3 IMPLANT
HOOD PEEL AWAY FLYTE STAYCOOL (MISCELLANEOUS) ×9 IMPLANT
IMMBOLIZER KNEE 19 BLUE UNIV (SOFTGOODS) ×3 IMPLANT
KIT TURNOVER KIT A (KITS) ×3 IMPLANT
NDL SAFETY ECLIPSE 18X1.5 (NEEDLE) ×2 IMPLANT
NEEDLE HYPO 18GX1.5 SHARP (NEEDLE) ×4
NEEDLE SPNL 20GX3.5 QUINCKE YW (NEEDLE) ×3 IMPLANT
NS IRRIG 1000ML POUR BTL (IV SOLUTION) ×3 IMPLANT
PACK TOTAL KNEE (MISCELLANEOUS) ×3 IMPLANT
PAD WRAPON POLAR KNEE (MISCELLANEOUS) ×1 IMPLANT
PULSAVAC PLUS IRRIG FAN TIP (DISPOSABLE) ×3
SOL .9 NS 3000ML IRR  AL (IV SOLUTION) ×2
SOL .9 NS 3000ML IRR UROMATIC (IV SOLUTION) ×1 IMPLANT
STAPLER SKIN PROX 35W (STAPLE) ×3 IMPLANT
SUCTION FRAZIER HANDLE 10FR (MISCELLANEOUS) ×2
SUCTION TUBE FRAZIER 10FR DISP (MISCELLANEOUS) ×1 IMPLANT
SUT VIC AB 0 CT1 36 (SUTURE) ×9 IMPLANT
SUT VIC AB 2-0 CT1 27 (SUTURE) ×6
SUT VIC AB 2-0 CT1 TAPERPNT 27 (SUTURE) ×3 IMPLANT
SYR 10ML LL (SYRINGE) ×3 IMPLANT
SYR 20CC LL (SYRINGE) ×3 IMPLANT
SYR 30ML LL (SYRINGE) ×9 IMPLANT
TIP FAN IRRIG PULSAVAC PLUS (DISPOSABLE) ×1 IMPLANT
TRAY FOLEY W/METER SILVER 16FR (SET/KITS/TRAYS/PACK) ×3 IMPLANT
WRAPON POLAR PAD KNEE (MISCELLANEOUS) ×3

## 2018-02-08 NOTE — Anesthesia Postprocedure Evaluation (Signed)
Anesthesia Post Note  Patient: Tyrone Bird  Procedure(s) Performed: TOTAL KNEE ARTHROPLASTY (Right Knee)  Patient location during evaluation: PACU Anesthesia Type: Spinal Level of consciousness: oriented and awake and alert Pain management: pain level controlled Vital Signs Assessment: post-procedure vital signs reviewed and stable Respiratory status: spontaneous breathing, respiratory function stable and patient connected to nasal cannula oxygen Cardiovascular status: blood pressure returned to baseline and stable Postop Assessment: no headache, no backache and no apparent nausea or vomiting Anesthetic complications: no     Last Vitals:  Vitals:   02/08/18 1321 02/08/18 1410  BP: 109/77 111/77  Pulse: 80 76  Resp: 18 20  Temp:  36.4 C  SpO2: 94% 100%    Last Pain:  Vitals:   02/08/18 1410  TempSrc: Oral  PainSc:                  Christia ReadingScott T Phillip Maffei

## 2018-02-08 NOTE — Transfer of Care (Signed)
Immediate Anesthesia Transfer of Care Note  Patient: Tyrone ParrotJimmy J Coward  Procedure(s) Performed: TOTAL KNEE ARTHROPLASTY (Right Knee)  Patient Location: PACU  Anesthesia Type:Spinal  Level of Consciousness: awake, alert  and oriented  Airway & Oxygen Therapy: Patient Spontanous Breathing and Patient connected to face mask oxygen  Post-op Assessment: Report given to RN and Post -op Vital signs reviewed and stable  Post vital signs: Reviewed and stable  Last Vitals:  Vitals:   02/08/18 0929  BP: (!) 173/89  Pulse: 84  Resp: 16  Temp: 36.4 C  SpO2: 99%    Last Pain:  Vitals:   02/08/18 0929  TempSrc: Temporal  PainSc: 6          Complications: No apparent anesthesia complications

## 2018-02-08 NOTE — Anesthesia Preprocedure Evaluation (Addendum)
Anesthesia Evaluation  Patient identified by MRN, date of birth, ID band Patient awake    Reviewed: Allergy & Precautions, H&P , NPO status , reviewed documented beta blocker date and time   Airway Mallampati: III  TM Distance: >3 FB     Dental  (+) Caps   Pulmonary former smoker,    Pulmonary exam normal        Cardiovascular hypertension, On Medications Normal cardiovascular exam     Neuro/Psych negative neurological ROS  negative psych ROS   GI/Hepatic Neg liver ROS, neg GERD  ,  Endo/Other  diabetes, Type 2  Renal/GU      Musculoskeletal  (+) Arthritis , Osteoarthritis,    Abdominal   Peds  Hematology negative hematology ROS (+)   Anesthesia Other Findings   Reproductive/Obstetrics                           Anesthesia Physical Anesthesia Plan  ASA: III  Anesthesia Plan: Spinal   Post-op Pain Management:    Induction:   PONV Risk Score and Plan: 2 and Propofol infusion  Airway Management Planned:   Additional Equipment:   Intra-op Plan:   Post-operative Plan:   Informed Consent: I have reviewed the patients History and Physical, chart, labs and discussed the procedure including the risks, benefits and alternatives for the proposed anesthesia with the patient or authorized representative who has indicated his/her understanding and acceptance.   Dental Advisory Given  Plan Discussed with: CRNA  Anesthesia Plan Comments:         Anesthesia Quick Evaluation

## 2018-02-08 NOTE — H&P (Signed)
Paper H&P to be scanned into permanent record. H&P reviewed and patient re-examined. No changes. 

## 2018-02-08 NOTE — Progress Notes (Signed)
Physical Therapy Evaluation Patient Details Name: Tyrone ParrotJimmy J Cinelli MRN: [id number] DOB: 1946-06-10 Today's Date: 02/08/2018   History of Present Illness  Pt underwent R TKR without reported post-op complications. He is POD#0 at time of initial PT evaluation  Clinical Impression  Pt admitted with above diagnosis. Pt currently with functional limitations due to the deficits listed below (see PT Problem List).  Pt is modified independent for bed mobility. CGA only for transfers and limited ambulation from bed to recliner. Pt is steady with use of rolling walker. AAROM is excellent for POD#0 at 0-112 degrees. Flexion limited by R knee dressings. Pt is able to complete all supine exercises as instructed. Pt will benefit from PT services to address deficits in strength, balance, and mobility in order to return to full function at home.     Follow Up Recommendations Home health PT    Equipment Recommendations  Rolling walker with 5" wheels;3in1 (PT)    Recommendations for Other Services       Precautions / Restrictions Precautions Precautions: Knee;Fall Precaution Booklet Issued: Yes (comment) Required Braces or Orthoses: Knee Immobilizer - Right Knee Immobilizer - Right: Discontinue once straight leg raise with < 10 degree lag Restrictions Weight Bearing Restrictions: Yes RLE Weight Bearing: Weight bearing as tolerated      Mobility  Bed Mobility Overal bed mobility: Modified Independent             General bed mobility comments: HOB elevated, use of bed rails, increased time  Transfers Overall transfer level: Needs assistance Equipment used: Rolling walker (2 wheeled) Transfers: Sit to/from Stand Sit to Stand: Min guard         General transfer comment: Cues for safe hand placement. Decreased weight shifting to RLE during transfer. Stable in standing with UE support on rolling walker  Ambulation/Gait Ambulation/Gait assistance: Min guard Ambulation Distance (Feet): 5  Feet Assistive device: Rolling walker (2 wheeled) Gait Pattern/deviations: Decreased step length - right;Decreased step length - left Gait velocity: Decreased Gait velocity interpretation: <1.8 ft/sec, indicative of risk for recurrent falls General Gait Details: Pt able to take short shuffling steps from bed to recliner. Decreased weight shift to RLE. Good stability in standing with bilateral UE support on rolling walker  Stairs            Wheelchair Mobility    Modified Rankin (Stroke Patients Only)       Balance Overall balance assessment: Needs assistance Sitting-balance support: No upper extremity supported Sitting balance-Leahy Scale: Good     Standing balance support: Bilateral upper extremity supported Standing balance-Leahy Scale: Fair Standing balance comment: Able to stabilize in standing with UE support on rolling walker                             Pertinent Vitals/Pain Pain Assessment: 0-10 Pain Score: 0-No pain Pain Location: Denies R knee pain at rest. Reports minimal increase after ambulation Pain Descriptors / Indicators: Operative site guarding Pain Intervention(s): Monitored during session    Home Living Family/patient expects to be discharged to:: Private residence Living Arrangements: Spouse/significant other(Son) Available Help at Discharge: Family Type of Home: House Home Access: Stairs to enter Entrance Stairs-Rails: Can reach both Entrance Stairs-Number of Steps: 3 Home Layout: Able to live on main level with bedroom/bathroom;Multi-level Home Equipment: Cane - single point;Shower seat;Other (comment)(Shower seat is in disrepair)      Prior Function Level of Independence: Independent with assistive device(s)  Comments: Independent with ADLs/IADLs. Ambulates with single point cane. 1 fall in the last 12 months. Drives     Hand Dominance   Dominant Hand: Right    Extremity/Trunk Assessment   Upper Extremity  Assessment Upper Extremity Assessment: Overall WFL for tasks assessed    Lower Extremity Assessment Lower Extremity Assessment: RLE deficits/detail RLE Deficits / Details: Able to perform SLR and SAQ without assist. Full DF/PF. Sensation intact to light touch. LLE strength WFL       Communication   Communication: No difficulties  Cognition Arousal/Alertness: Awake/alert Behavior During Therapy: WFL for tasks assessed/performed Overall Cognitive Status: Within Functional Limits for tasks assessed                                        General Comments      Exercises Total Joint Exercises Ankle Circles/Pumps: Both;10 reps Quad Sets: Both;10 reps Gluteal Sets: Both;10 reps Towel Squeeze: Both;10 reps Short Arc Quad: Right;10 reps Heel Slides: Right;10 reps Hip ABduction/ADduction: Right;10 reps Straight Leg Raises: Right;10 reps Goniometric ROM: 0-112 degrees AAROM, limited in flexion by dressings   Assessment/Plan    PT Assessment Patient needs continued PT services  PT Problem List Decreased strength;Decreased range of motion;Decreased balance;Decreased mobility;Pain       PT Treatment Interventions DME instruction;Gait training;Stair training;Therapeutic activities;Therapeutic exercise;Balance training;Neuromuscular re-education;Patient/family education;Manual techniques    PT Goals (Current goals can be found in the Care Plan section)  Acute Rehab PT Goals Patient Stated Goal: Return to prior function at home PT Goal Formulation: With patient Time For Goal Achievement: 02/22/18 Potential to Achieve Goals: Good    Frequency BID   Barriers to discharge        Co-evaluation               AM-PAC PT "6 Clicks" Daily Activity  Outcome Measure Difficulty turning over in bed (including adjusting bedclothes, sheets and blankets)?: A Little Difficulty moving from lying on back to sitting on the side of the bed? : A Little Difficulty sitting  down on and standing up from a chair with arms (e.g., wheelchair, bedside commode, etc,.)?: A Little Help needed moving to and from a bed to chair (including a wheelchair)?: A Little Help needed walking in hospital room?: A Little Help needed climbing 3-5 steps with a railing? : Total 6 Click Score: 16    End of Session Equipment Utilized During Treatment: Gait belt Activity Tolerance: Patient tolerated treatment well Patient left: in chair;with call bell/phone within reach;with chair alarm set;with SCD's reapplied;Other (comment)(towel roll under heel, polar care in place) Nurse Communication: Mobility status PT Visit Diagnosis: Unsteadiness on feet (R26.81);Muscle weakness (generalized) (M62.81);Pain Pain - Right/Left: Right Pain - part of body: Knee    Time: 1610-9604 PT Time Calculation (min) (ACUTE ONLY): 32 min   Charges:   PT Evaluation $PT Eval Low Complexity: 1 Low PT Treatments $Therapeutic Exercise: 8-22 mins   PT G Codes:        Sharalyn Ink Braleigh Massoud PT, DPT    Tequlia Gonsalves 02/08/2018, 4:15 PM

## 2018-02-08 NOTE — Op Note (Signed)
02/08/2018  1:05 PM  Patient:   Tyrone ParrotJimmy J Mckamey  Pre-Op Diagnosis:   Degenerative joint disease, right knee.  Post-Op Diagnosis:   Same  Procedure:   Right TKA using all-cemented [employer] Vanguard system with a 70 mm PCR femur, a 75 mm tibial tray with a 10 mm E-poly AS insert, and a 34 x 8.5 mm all-poly 3-pegged domed patella.  Surgeon:   Maryagnes AmosJ. Jeffrey Kimbella Heisler, MD  Assistant:   Horris LatinoLance McGhee, PA-C   Anesthesia:   Spinal  Findings:   As above  Complications:   None  EBL:   10 cc  Fluids:   1200 cc crystalloid  UOP:   250 cc  TT:   95 minutes at 300 mmHg  Drains:   None  Closure:   Staples  Implants:   As above  Brief Clinical Note:   The patient is a 72 year old male with a long history of progressively worsening right knee pain. The patient's symptoms have progressed despite medications, activity modification, injections, etc. The patient's history and examination were consistent with advanced degenerative joint disease of the right knee confirmed by plain radiographs. The patient presents at this time for a right total knee arthroplasty.  Procedure:   The patient was brought into the operating room. After adequate spinal anesthesia was obtained, the patient was lain in the supine position. A Foley catheter was placed by the nurse before the right lower extremity was prepped with ChloraPrep solution and draped sterilely. Preoperative antibiotics were administered. After verifying the proper laterality with a surgical timeout, the limb was exsanguinated with an Esmarch and the tourniquet inflated to 300 mmHg. A standard anterior approach to the knee was made through an approximately 7 inch incision. The incision was carried down through the subcutaneous tissues to expose superficial retinaculum. This was split the length of the incision and the medial flap elevated sufficiently to expose the medial retinaculum. The medial retinaculum was incised, leaving a 3-4 mm cuff of tissue on the  patella. This was extended distally along the medial border of the patellar tendon and proximally through the medial third of the quadriceps tendon. A subtotal fat pad excision was performed before the soft tissues were elevated off the anteromedial and anterolateral aspects of the proximal tibia to the level of the collateral ligaments. The anterior portions of the medial and lateral menisci were removed, as was the anterior cruciate ligament. With the knee flexed to 90, the external tibial guide was positioned and the appropriate proximal tibial cut made. This piece was taken to the back table where it was measured and found to be optimally replicated by a 75 mm component.  Attention was directed to the distal femur. The intramedullary canal was accessed through a 3/8" drill hole. The intramedullary guide was inserted and position in order to obtain a neutral flexion gap. The intercondylar block was positioned with care taken to avoid notching the anterior cortex of the femur. The appropriate cut was made. Next, the distal cutting block was placed at 6 of valgus alignment. Using the 9 mm slot, the distal cut was made. The distal femur was measured and found to be optimally replicated by the 70 mm component. The 70 mm 4-in-1 cutting block was positioned and first the posterior, then the posterior chamfer, the anterior chamfer, femoral and intercondylar cuts were made. At this point, the posterior portions medial and lateral menisci were removed. A trial reduction was performed using the appropriate femoral and tibial components with the 10  mm insert. This demonstrated excellent stability to varus and valgus stressing both in flexion and extension while permitting full extension. Patella tracking was assessed and found to be excellent. Therefore, the tibial guide position was marked on the proximal tibia. The patella thickness was measured and found to be 24 mm. Therefore, the appropriate cut was made. The  patellar surface was measured and found to be optimally replicated by the 34 mm component. The three peg holes were drilled in place before the trial button was inserted. Patella tracking was assessed and found to be excellent, passing the "no thumb test". The lug holes were drilled into the distal femur before the trial component was removed, leaving only the tibial tray. The keel was then created using the appropriate tower, reamer, and punch.  The bony surfaces were prepared for cementing by irrigating thoroughly with bacitracin saline solution. A bone plug was fashioned from some of the bone that had been removed previously and used to plug the distal femoral canal. In addition, 20 cc of Exparel diluted out to 60 cc with normal saline and 30 cc of 0.5% Sensorcaine were injected into the postero-medial and postero-lateral aspects of the knee, the medial and lateral gutter regions, and the peri-incisional tissues to help with postoperative analgesia. Meanwhile, the cement was being mixed on the back table. When it was ready, the tibial tray was cemented in first. The excess cement was removed using Personal assistant. Next, the femoral component was impacted into place. Again, the excess cement was removed using Personal assistant. The 10 mm trial insert was positioned and the knee brought into extension while the cement hardened. Finally, the patella was cemented into place and secured using the patellar clamp. Again, the excess cement was removed using Personal assistant. Once the cement had hardened, the knee was placed through a range of motion with the findings as described above. Therefore, the trial insert was removed and, after verifying that no cement had been retained posteriorly, the permanent insert was positioned and secured using the appropriate key locking mechanism. Again the knee was placed through a range of motion with the findings as described above.  The wound was copiously irrigated with  bacitracin saline solution using the jet lavage system before the quadriceps tendon and retinacular layer were reapproximated using #0 Vicryl interrupted sutures. The superficial retinacular layer also was closed using a running #0 Vicryl suture. A total of 10 cc of transexemic acid (TXA) was injected intra-articularly before the subcutaneous tissues were closed in several layers using 2-0 Vicryl interrupted sutures. The skin was closed using staples. A sterile honeycomb dressing was applied to the skin before the leg was wrapped with an Ace wrap to accommodate the polar pack. The patient was then awakened and returned to the recovery room in satisfactory condition after tolerating the procedure well.

## 2018-02-08 NOTE — Anesthesia Procedure Notes (Signed)
Spinal  Patient location during procedure: OR Preanesthetic Checklist Completed: patient identified, site marked, surgical consent, pre-op evaluation, timeout performed, IV checked, risks and benefits discussed and monitors and equipment checked Spinal Block Patient position: sitting Prep: Betadine Patient monitoring: heart rate, continuous pulse ox, blood pressure and cardiac monitor Approach: midline Location: L4-5 Injection technique: single-shot Needle Needle type: Whitacre and Introducer  Needle gauge: 24 G Needle length: 9 cm Additional Notes Negative paresthesia. Negative blood return. Positive free-flowing CSF. Expiration date of kit checked and confirmed. Patient tolerated procedure well, without complications.       

## 2018-02-08 NOTE — Anesthesia Post-op Follow-up Note (Signed)
Anesthesia QCDR form completed.        

## 2018-02-09 ENCOUNTER — Encounter: Payer: Self-pay | Admitting: Surgery

## 2018-02-09 LAB — CBC WITH DIFFERENTIAL/PLATELET
BASOS PCT: 0 %
Basophils Absolute: 0 10*3/uL (ref 0–0.1)
EOS ABS: 0.3 10*3/uL (ref 0–0.7)
EOS PCT: 4 %
HCT: 36.1 % — ABNORMAL LOW (ref 40.0–52.0)
HEMOGLOBIN: 12.3 g/dL — AB (ref 13.0–18.0)
LYMPHS ABS: 1.8 10*3/uL (ref 1.0–3.6)
Lymphocytes Relative: 20 %
MCH: 31.1 pg (ref 26.0–34.0)
MCHC: 33.9 g/dL (ref 32.0–36.0)
MCV: 91.7 fL (ref 80.0–100.0)
MONO ABS: 1.1 10*3/uL — AB (ref 0.2–1.0)
Monocytes Relative: 12 %
Neutro Abs: 5.9 10*3/uL (ref 1.4–6.5)
Neutrophils Relative %: 64 %
PLATELETS: 223 10*3/uL (ref 150–440)
RBC: 3.94 MIL/uL — ABNORMAL LOW (ref 4.40–5.90)
RDW: 13.1 % (ref 11.5–14.5)
WBC: 9.2 10*3/uL (ref 3.8–10.6)

## 2018-02-09 LAB — BASIC METABOLIC PANEL
Anion gap: 9 (ref 5–15)
BUN: 12 mg/dL (ref 6–20)
CALCIUM: 8.3 mg/dL — AB (ref 8.9–10.3)
CHLORIDE: 109 mmol/L (ref 101–111)
CO2: 19 mmol/L — ABNORMAL LOW (ref 22–32)
CREATININE: 0.89 mg/dL (ref 0.61–1.24)
GFR calc non Af Amer: >60 mL/min (ref >60)
Glucose, Bld: 113 mg/dL — ABNORMAL HIGH (ref 65–99)
Potassium: 4.6 mmol/L (ref 3.5–5.1)
SODIUM: 137 mmol/L (ref 135–145)

## 2018-02-09 LAB — GLUCOSE, CAPILLARY
GLUCOSE-CAPILLARY: 120 mg/dL — AB (ref 65–99)
Glucose-Capillary: 139 mg/dL — ABNORMAL HIGH (ref 65–99)

## 2018-02-09 MED ORDER — OXYCODONE HCL 5 MG PO TABS: 5.0000 mg | ORAL_TABLET | ORAL | 0 refills | Status: DC | PRN

## 2018-02-09 MED ORDER — TRAMADOL HCL 50 MG PO TABS: 50.0000 mg | ORAL_TABLET | Freq: Four times a day (QID) | ORAL | 0 refills | Status: DC | PRN

## 2018-02-09 MED ORDER — ENOXAPARIN SODIUM 40 MG/0.4ML ~~LOC~~ SOLN: 40.0000 mg | SUBCUTANEOUS | 0 refills | Status: DC

## 2018-02-09 MED ORDER — TRAMADOL HCL 50 MG PO TABS
50.0000 mg | ORAL_TABLET | Freq: Four times a day (QID) | ORAL | Status: DC | PRN
  Administered 2018-02-09: 100 mg via ORAL
  Filled 2018-02-09: qty 2

## 2018-02-09 NOTE — Care Management (Signed)
Patient would like to use [facility] for home health services. Referral to Dorene SorrowJerry with [facility] and that patient may discharge to home this PM.  Lovenox $100.54- patient agrees. Rolling [facility]

## 2018-02-09 NOTE — Progress Notes (Signed)
PT Cancellation Note  Patient Details Name: Tyrone Bird MRN: [id number] DOB: 11/02/1946   Cancelled Treatment:    Reason Eval/Treat Not Completed: Patient declined PT services this PM secondary to fatigue and wishing to let his leg rest.  Will attempt to see pt at a future date and time as medically appropriate.    Ovidio Hanger. Scott Evalyse Stroope PT, DPT 02/09/18, 2:46 PM

## 2018-02-09 NOTE — Progress Notes (Signed)
  Subjective: 1 Day Post-Op Procedure(s) (LRB): TOTAL KNEE ARTHROPLASTY (Right) Patient reports pain as 6 on 0-10 scale.   Patient is well, and has had no acute complaints or problems Plan is to go Home after hospital stay. Negative for chest pain and shortness of breath Fever: no Gastrointestinal:Negative for nausea and vomiting  Objective: Vital signs in last 24 hours: Temp:  [97.6 F (36.4 C)-98.4 F (36.9 C)] 97.8 F (36.6 C) (02/22 0307) Pulse Rate:  [76-89] 78 (02/22 0307) Resp:  [16-20] 19 (02/22 0047) BP: (109-173)/(71-89) 134/74 (02/22 0307) SpO2:  [94 %-100 %] 98 % (02/22 0307) FiO2 (%):  [28 %] 28 % (02/21 1410) Weight:  [112 kg (247 lb)] 112 kg (247 lb) (02/21 0929)  Intake/Output from previous day:  Intake/Output Summary (Last 24 hours) at 02/09/2018 0736 Last data filed at 02/09/2018 [id number] Gross per 24 hour  Intake 3026.67 ml  Output 410 ml  Net 2616.67 ml    Intake/Output this shift: No intake/output data recorded.  Labs: Recent Labs    02/09/18 0339  HGB 12.3*   Recent Labs    02/09/18 0339  WBC 9.2  RBC 3.94*  HCT 36.1*  PLT 223   Recent Labs    02/09/18 0339  NA 137  K 4.6  CL 109  CO2 19*  BUN 12  CREATININE 0.89  GLUCOSE 113*  CALCIUM 8.3*   No results for input(s): LABPT, INR in the last 72 hours.   EXAM General - Patient is Alert, Appropriate and Oriented Extremity - ABD soft Sensation intact distally Intact pulses distally Dorsiflexion/Plantar flexion intact Incision: moderate drainage No cellulitis present Dressing/Incision - blood tinged drainage Motor Function - intact, moving foot and toes well on exam.   Abdomen slightly distended this morning, normal bowel sounds with mild tympany.  Past Medical History:  Diagnosis Date  . Arthritis   . Diabetes mellitus without complication (HCC)   . Elevated lipids   . Environmental and seasonal allergies   . Hypertension     Assessment/Plan: 1 Day Post-Op Procedure(s)  (LRB): TOTAL KNEE ARTHROPLASTY (Right) Active Problems:   Status post total knee replacement using cement, right  Estimated body mass index is 36.48 kg/m as calculated from the following:   Height as of this encounter: 5\' 9"  (1.753 m).   Weight as of this encounter: 112 kg (247 lb). Advance diet Up with therapy D/C IV fluids when tolerating po intake.  Labs reviewed this am, Hg 12.3 PT recommending HHPT.   Begin working on bowel movement, gradual increase in oral intake due to abdomen distention. Plan will be for discharge home tomorrow.  DVT Prophylaxis - Lovenox, Foot Pumps and TED hose Weight-Bearing as tolerated to right leg  J. Horris LatinoLance Martez Weiand, PA-C [facility] 02/09/2018, 7:36 AM

## 2018-02-09 NOTE — Progress Notes (Signed)
Physical Therapy Treatment Patient Details Name: Tyrone Bird MRN: [id number] DOB: Nov 19, 1946 Today's Date: 02/09/2018    History of Present Illness Pt underwent R TKR without reported post-op complications.      PT Comments    Pt presents with minor deficits in strength, transfers, mobility, gait, balance, and activity tolerance but is progressing well towards goals.  Pt Mod Ind with bed mobility tasks and SBA with transfers.  Pt able to amb 200' with RW and SBA with good cadence and stability.  Pt steady ascending and descending 5 steps with B rails with good carry over regarding proper sequencing.  Pt did have some bleeding from the R knee with nursing called and left with pt changing bandages at the end of the session.  Pt will benefit from HHPT services upon discharge to safely address above deficits for decreased caregiver assistance and eventual return to PLOF.     Follow Up Recommendations  Home health PT     Equipment Recommendations  Rolling walker with 5" wheels;3in1 (PT)    Recommendations for Other Services       Precautions / Restrictions Precautions Precautions: Knee;Fall Required Braces or Orthoses: Other Brace/Splint Other Brace/Splint: Pt able to perform Ind RLE SLR x 10 without extensor lag, no KI required Restrictions Weight Bearing Restrictions: Yes RLE Weight Bearing: Weight bearing as tolerated    Mobility  Bed Mobility Overal bed mobility: Modified Independent             General bed mobility comments: HOB elevated, use of bed rails, increased time  Transfers Overall transfer level: Needs assistance Equipment used: Rolling walker (2 wheeled) Transfers: Sit to/from Stand Sit to Stand: Supervision         General transfer comment: Min verbal cues for sequencing during transfers with good eccentric and concentric control  Ambulation/Gait Ambulation/Gait assistance: Supervision Ambulation Distance (Feet): 200 Feet Assistive device: Rolling  walker (2 wheeled) Gait Pattern/deviations: Step-through pattern;Decreased step length - right;Decreased step length - left   Gait velocity interpretation: Below normal speed for age/gender General Gait Details: Good cadence with amb with no instability noted; min verbal cues for amb closer to RW provided   Stairs Stairs: Yes   Stair Management: Two rails Number of Stairs: 5 General stair comments: Min verbal and visual cues for sequencing with good carry over; pt steady ascending and descending stairs with B rails  Wheelchair Mobility    Modified Rankin (Stroke Patients Only)       Balance Overall balance assessment: Needs assistance Sitting-balance support: No upper extremity supported Sitting balance-Leahy Scale: Good     Standing balance support: Bilateral upper extremity supported Standing balance-Leahy Scale: Good                              Cognition Arousal/Alertness: Awake/alert Behavior During Therapy: WFL for tasks assessed/performed Overall Cognitive Status: Within Functional Limits for tasks assessed                                        Exercises Total Joint Exercises Ankle Circles/Pumps: AROM;Both;10 reps Quad Sets: Right;10 reps;15 reps Gluteal Sets: Strengthening;Both;10 reps Hip ABduction/ADduction: AROM;Both;10 reps Straight Leg Raises: AROM;Both;10 reps Long Arc Quad: AROM;Right;10 reps;15 reps Knee Flexion: AROM;Right;10 reps;15 reps Goniometric ROM: R knee AROM 0-98, limited in flexion by dressings Marching in Standing: AROM;Both;10 reps  General Comments        Pertinent Vitals/Pain Pain Assessment: 0-10 Pain Score: 2  Pain Location: R knee Pain Descriptors / Indicators: Sore Pain Intervention(s): Premedicated before session;Monitored during session;Limited activity within patient's tolerance    Home Living                      Prior Function            PT Goals (current goals can now  be found in the care plan section) Progress towards PT goals: Progressing toward goals    Frequency    BID      PT Plan Current plan remains appropriate    Co-evaluation              AM-PAC PT "6 Clicks" Daily Activity  Outcome Measure                   End of Session Equipment Utilized During Treatment: Gait belt Activity Tolerance: Patient tolerated treatment well Patient left: in bed;with bed alarm set;with nursing/sitter in room;with call bell/phone within reach;Other (comment)(Pt with some bleeding from R knee at end of session with nursing applying new dressing at end of PT session) Nurse Communication: Mobility status PT Visit Diagnosis: Unsteadiness on feet (R26.81);Muscle weakness (generalized) (M62.81);Pain Pain - Right/Left: Right Pain - part of body: Knee     Time: 1000-1030 PT Time Calculation (min) (ACUTE ONLY): 30 min  Charges:  $Gait Training: 8-22 mins $Therapeutic Exercise: 8-22 mins                    G Codes:       D. Scott Eduin Friedel PT, DPT 02/09/18, 1:15 PM

## 2018-02-09 NOTE — Discharge Instructions (Signed)

## 2018-02-09 NOTE — Discharge Summary (Signed)
Physician Discharge Summary  Patient ID: Tyrone Bird MRN: [id number] DOB/AGE: [birth year] 72 y.o.  Admit date: 02/08/2018 Discharge date: 02/09/2018  Admission Diagnoses:  PRIMARY OSTEOARTHRITIS OF RIGHT KNEE  Discharge Diagnoses: Patient Active Problem List   Diagnosis Date Noted  . Status post total knee replacement using cement, right 02/08/2018  Primary osteoarthritis of the right knee.  Past Medical History:  Diagnosis Date  . Arthritis   . Diabetes mellitus without complication (HCC)   . Elevated lipids   . Environmental and seasonal allergies   . Hypertension      Transfusion: None.   Consultants (if any):   Discharged Condition: Improved  Hospital Course: LARRELL RAPOZO is an 72 y.o. male who was admitted 02/08/2018 with a diagnosis of right knee osteoarthritis and went to the operating room on 02/08/2018 and underwent the above named procedures.    Surgeries: Procedure(s): TOTAL KNEE ARTHROPLASTY on 02/08/2018 Patient tolerated the surgery well. Taken to PACU where she was stabilized and then transferred to the orthopedic floor.  Started on Lovenox 40mg  q 24 hrs. Foot pumps applied bilaterally at 80 mm. Heels elevated on bed with rolled towels. No evidence of DVT. Negative Homan. Physical therapy started on day #1 for gait training and transfer. OT started day #1 for ADL and assisted devices.  Patient's IV was removed on POD1.  Foley removed following surgery.  Implants: Right TKA using all-cemented [employer] Vanguard system with a 70 mm PCR femur, a 75 mm tibial tray with a 10 mm E-poly AS insert, and a 34 x 8.5 mm all-poly 3-pegged domed patella.  He was given perioperative antibiotics:  Anti-infectives (From admission, onward)   Start     Dose/Rate Route Frequency Ordered Stop   02/08/18 1700  ceFAZolin (ANCEF) IVPB 2g/100 mL premix     2 g 200 mL/hr over 30 Minutes Intravenous Every 6 hours 02/08/18 1409 02/09/18 1059   02/08/18 0925  ceFAZolin (ANCEF) 2-4  GM/100ML-% IVPB    Comments:  Renie Ora   : cabinet override      02/08/18 0925 02/08/18 2144   02/08/18 0918  ceFAZolin (ANCEF) 2-4 GM/100ML-% IVPB    Comments:  Manfred Arch   : cabinet override      02/08/18 0918 02/08/18 2129   02/07/18 2215  ceFAZolin (ANCEF) IVPB 2g/100 mL premix  Status:  Discontinued     2 g 200 mL/hr over 30 Minutes Intravenous  Once 02/07/18 2211 02/08/18 1408    .  He was given sequential compression devices, early ambulation, and Lovenox for DVT prophylaxis.  He benefited maximally from the hospital stay and there were no complications.    Recent vital signs:  Vitals:   02/09/18 1303 02/09/18 1607  BP:  (!) 149/90  Pulse: 88 88  Resp:  18  Temp:  98.8 F (37.1 C)  SpO2: 94% 96%    Recent laboratory studies:  Lab Results  Component Value Date   HGB 12.3 (L) 02/09/2018   HGB 14.5 01/31/2018   Lab Results  Component Value Date   WBC 9.2 02/09/2018   PLT 223 02/09/2018   Lab Results  Component Value Date   INR 1.04 01/31/2018   Lab Results  Component Value Date   NA 137 02/09/2018   K 4.6 02/09/2018   CL 109 02/09/2018   CO2 19 (L) 02/09/2018   BUN 12 02/09/2018   CREATININE 0.89 02/09/2018   GLUCOSE 113 (H) 02/09/2018    Discharge Medications:   Allergies  as of 02/09/2018      Reactions   Sulfa Antibiotics Other (See Comments)   Childhood allergy      Medication List    TAKE these medications   acetaminophen 500 MG tablet Commonly known as:  TYLENOL Take 500 mg by mouth every 6 (six) hours as needed (for pain. (1 tablet scheduled at night w/tramadol)).   aspirin EC 81 MG tablet Take 81 mg by mouth daily.   atorvastatin 80 MG tablet Commonly known as:  LIPITOR Take 80 mg by mouth daily.   enalapril 10 MG tablet Commonly known as:  VASOTEC Take 10 mg by mouth daily.   enoxaparin 40 MG/0.4ML injection Commonly known as:  LOVENOX Inject 0.4 mLs (40 mg total) into the skin daily.   fluticasone 50 MCG/ACT  nasal spray Commonly known as:  FLONASE Place 1 spray into both nostrils daily as needed for allergies or rhinitis.   metFORMIN 500 MG tablet Commonly known as:  GLUCOPHAGE Take 500 mg by mouth daily.   multivitamin with minerals Tabs tablet Take 1 tablet by mouth daily.   oxyCODONE 5 MG immediate release tablet Commonly known as:  Oxy IR/ROXICODONE Take 1-2 tablets (5-10 mg total) by mouth every 4 (four) hours as needed for moderate pain.   phenylephrine 1 % nasal spray Commonly known as:  NEO-SYNEPHRINE Place 1 drop into both nostrils 2 (two) times daily as needed for congestion.   traMADol 50 MG tablet Commonly known as:  ULTRAM Take 50 mg by mouth at bedtime. What changed:  Another medication with the same name was added. Make sure you understand how and when to take each.   traMADol 50 MG tablet Commonly known as:  ULTRAM Take 1-2 tablets (50-100 mg total) by mouth every 6 (six) hours as needed for severe pain. What changed:  You were already taking a medication with the same name, and this prescription was added. Make sure you understand how and when to take each.            Durable Medical Equipment  (From admission, onward)        Start     Ordered   02/08/18 1410  DME Bedside commode  Once    Question:  Patient needs a bedside commode to treat with the following condition  Answer:  Status post total knee replacement using cement, right   02/08/18 1409   02/08/18 1410  DME 3 n 1  Once     02/08/18 1409   02/08/18 1410  DME Walker rolling  Once    Question:  Patient needs a walker to treat with the following condition  Answer:  Status post total knee replacement using cement, right   02/08/18 1409      Diagnostic Studies: Dg Chest 2 View  Result Date: 01/31/2018 CLINICAL DATA:  Preop evaluation for knee replacement surgery, history hypertension, former smoker EXAM: CHEST  2 VIEW COMPARISON:  None FINDINGS: Normal heart size, mediastinal contours, and  pulmonary vascularity. Atherosclerotic calcification aorta. Lungs clear. No pleural effusion or pneumothorax. LEFT nipple shadow on PA view, seen on lateral view as well. Scattered degenerative disc disease changes thoracic spine and osseous demineralization. IMPRESSION: No acute abnormalities. Electronically Signed   By: Ulyses Southward M.D.   On: 01/31/2018 14:47   Dg Knee Right Port  Result Date: 02/08/2018 CLINICAL DATA:  Followup right knee replacement. EXAM: PORTABLE RIGHT KNEE - 1-2 VIEW COMPARISON:  None. FINDINGS: Total knee replacement on the right. Air in  the joint as expected. Components appear well positioned. No radiographically detectable complication. IMPRESSION: Good appearance following total knee replacement. Electronically Signed   By: Paulina FusiMark  Shogry M.D.   On: 02/08/2018 13:52   Disposition: Discharge home today with HHPT.  Follow-up Information    Anson OregonMcGhee, Helvi Royals Lance, PA-C Follow up in 14 day(s).   Specialty:  Physician Assistant Why:  Mindi SlickerStaple Removal. Contact information: [address] [facility] [place] [id number] [phone]          Signed: Meriel PicaJames L Deairra Halleck PA-C 02/09/2018, 4:16 PM

## 2018-02-09 NOTE — Care Management Note (Signed)
Case Management Note  Patient Details  Name: Tyrone Bird MRN: [id number] Date of Birth: [birth year]  Subjective/Objective:                  RNCM met with patient to discuss transition of care. He plans to return home with his wife and son. He will need a rolling walker as at baseline he is independent.  He uses [facility] [phone] for medications.  Dr. Roland Rack would like Lovenox/genic okay 6m injection daily for 14 days-no refills called in to patient's pharmacy for price.   Action/Plan:   Home health list provided. [place] [facility] [place] [place]   Expected Discharge Date:                  Expected Discharge Plan:     In-House Referral:     Discharge planning Services  CM Consult  Post Acute Care Choice:  Durable Medical Equipment, Home Health Choice offered to:  Patient  DME Arranged:  Walker rolling [facility]  [facility]     Status of Service:  In process, will continue to follow  If discussed at Long Length of Stay Meetings, dates discussed:    Additional Comments:  AMarshell Garfinkel RN 02/09/2018, 8:12 AM

## 2018-02-09 NOTE — Plan of Care (Signed)
  Progressing Education: Knowledge of General Education information will improve 02/09/2018 1544 - Progressing by Cordie Griceodriguez, Shakiera Edelson A, RN Health Behavior/Discharge Planning: Ability to manage health-related needs will improve 02/09/2018 1544 - Progressing by Cordie Griceodriguez, Aroldo Galli A, RN Clinical Measurements: Ability to maintain clinical measurements within normal limits will improve 02/09/2018 1544 - Progressing by Cordie Griceodriguez, Howie Rufus A, RN Will remain free from infection 02/09/2018 1544 - Progressing by Cordie Griceodriguez, Seiji Wiswell A, RN Diagnostic test results will improve 02/09/2018 1544 - Progressing by Cordie Griceodriguez, Kimmie Doren A, RN Respiratory complications will improve 02/09/2018 1544 - Progressing by Cordie Griceodriguez, Champ Keetch A, RN Cardiovascular complication will be avoided 02/09/2018 1544 - Progressing by Cordie Griceodriguez, Blessings Inglett A, RN Activity: Risk for activity intolerance will decrease 02/09/2018 1544 - Progressing by Cordie Griceodriguez, Katrell Milhorn A, RN Nutrition: Adequate nutrition will be maintained 02/09/2018 1544 - Progressing by Cordie Griceodriguez, Sarea Fyfe A, RN Coping: Level of anxiety will decrease 02/09/2018 1544 - Progressing by Cordie Griceodriguez, Merick Kelleher A, RN Elimination: Will not experience complications related to bowel motility 02/09/2018 1544 - Progressing by Cordie Griceodriguez, Sherae Santino A, RN Will not experience complications related to urinary retention 02/09/2018 1544 - Progressing by Cordie Griceodriguez, Lavette Yankovich A, RN Pain Managment: General experience of comfort will improve 02/09/2018 1544 - Progressing by Cordie Griceodriguez, Alpha Chouinard A, RN Safety: Ability to remain free from injury will improve 02/09/2018 1544 - Progressing by Cordie Griceodriguez, Xochil Shanker A, RN Skin Integrity: Risk for impaired skin integrity will decrease 02/09/2018 1544 - Progressing by Cordie Griceodriguez, Nakkia Mackiewicz A, RN Education: Knowledge of the prescribed therapeutic regimen will improve 02/09/2018 1544 - Progressing by Cordie Griceodriguez, Soni Kegel A, RN Activity: Ability to avoid complications of mobility impairment will  improve 02/09/2018 1544 - Progressing by Cordie Griceodriguez, Jacayla Nordell A, RN Range of joint motion will improve 02/09/2018 1544 - Progressing by Cordie Griceodriguez, Odile Veloso A, RN Clinical Measurements: Postoperative complications will be avoided or minimized 02/09/2018 1544 - Progressing by Cordie Griceodriguez, Yulieth Carrender A, RN Pain Management: Pain level will decrease with appropriate interventions 02/09/2018 1544 - Progressing by Cordie Griceodriguez, Yoseph Haile A, RN Skin Integrity: Signs of wound healing will improve 02/09/2018 1544 - Progressing by Cordie Griceodriguez, Nikita Humble A, RN

## 2018-02-09 NOTE — Progress Notes (Signed)
All DC instructions given and verbalized understanding. Prescriptions and paperwork given to wife. IV removed. Patient assisted with getting dressed and discharged via wheelchair and private vehicle. No distress or complaints noted at time of DC.

## 2018-05-17 ENCOUNTER — Other Ambulatory Visit: Payer: Self-pay | Admitting: Internal Medicine

## 2018-05-17 ENCOUNTER — Ambulatory Visit
Admission: RE | Admit: 2018-05-17 | Discharge: 2018-05-17 | Disposition: A | Discharge status: Home / Self Care | Payer: Medicare PPO | Source: Ambulatory Visit | Attending: Internal Medicine | Admitting: Internal Medicine

## 2018-05-17 DIAGNOSIS — R609 Edema, unspecified: Secondary | ICD-10-CM

## 2018-05-17 DIAGNOSIS — R600 Localized edema: Secondary | ICD-10-CM | POA: Insufficient documentation

## 2018-06-01 ENCOUNTER — Ambulatory Visit: Payer: Self-pay | Admitting: Urology

## 2018-07-06 ENCOUNTER — Ambulatory Visit: Payer: Self-pay | Admitting: Urology

## 2018-07-06 ENCOUNTER — Encounter: Payer: Self-pay | Admitting: Urology

## 2018-07-06 ENCOUNTER — Ambulatory Visit: Payer: Medicare PPO | Admitting: Urology

## 2018-07-06 VITALS — BP 123/80 | HR 80 | Ht 69.0 in | Wt 247.0 lb

## 2018-07-06 DIAGNOSIS — R972 Elevated prostate specific antigen [PSA]: Secondary | ICD-10-CM

## 2018-07-06 DIAGNOSIS — Z87440 Personal history of urinary (tract) infections: Secondary | ICD-10-CM | POA: Diagnosis not present

## 2018-07-06 LAB — URINALYSIS, COMPLETE
Bilirubin, UA: NEGATIVE
Glucose, UA: NEGATIVE
Ketones, UA: NEGATIVE
NITRITE UA: NEGATIVE
PH UA: 5.5 (ref 5.0–7.5)
Protein, UA: NEGATIVE
RBC, UA: NEGATIVE
Specific Gravity, UA: 1.025 (ref 1.005–1.030)
UUROB: 0.2 mg/dL (ref 0.2–1.0)

## 2018-07-06 LAB — MICROSCOPIC EXAMINATION: Epithelial Cells (non renal): NONE SEEN /hpf (ref 0–10)

## 2018-07-06 NOTE — Progress Notes (Signed)
07/06/2018 10:12 AM   Tyrone Bird [birth year] [id number]  Referring provider: Lauro RegulusAnderson, Marshall W, MD [address] [facility] [place], [place] [id number]  Chief Complaint  Patient presents with  . Elevated PSA    New Patient    HPI: 72 year old male who presents today for further evaluation of elevated PSA.  He underwent routine annual PSA screening by his primary care physician on 05/10/2018 at which time his PSA was noted to be elevated to 4.55.  This is up significantly from his previous value of 3.07 back in 08/2014.  He denies any baseline urinary symptoms.  He reports that he has a good urinary stream is able to empty his bladder completely.  He denies any significant urinary frequency or urgency.  Occasionally has urgency when he waits too long to void.  He he previously had issues with nocturia but this is also since resolved since starting taking Tylenol PM at nighttime.  Notably, he did have a urinalysis in 01/2018 which was suspicious for infection.  This was performed at the time of preop evaluation for any surgery.  He was treated for UTI and was relatively asymptomatic.  He is uncircumcised.  Rectal: Slightly decreased sphincter tone.  He was seen in the remote past by what sounds like either Dr. Achilles Dunkope or Dr. Lonna CobbStoioff.  He cannot recall why he was being seen.  He denies a personal history of elevated PSA in the past.  He has been told that his prostate is enlarged in the past which time he was having some urinary symptoms.  He was on a BPH medication but stopped that many years ago at with resolution of his urinary symptoms.  He has no family history of prostate cancer.  No weight loss or bone pain.   PMH: Past Medical History:  Diagnosis Date  . Arthritis   . Diabetes mellitus without complication (HCC)   . Elevated lipids   . Environmental and seasonal allergies   . Hypertension     Surgical History: Past Surgical History:  Procedure  Laterality Date  . KNEE ARTHROSCOPY    . TOTAL KNEE ARTHROPLASTY Right 02/08/2018   Procedure: TOTAL KNEE ARTHROPLASTY;  Surgeon: Christena FlakePoggi, John J, MD;  Location: [facility] ORS;  Service: Orthopedics;  Laterality: Right;    Home Medications:  Allergies as of 07/06/2018      Reactions   Sulfa Antibiotics Other (See Comments)   Childhood allergy      Medication List        Accurate as of 07/06/18 10:12 AM. Always use your most recent med list.          acetaminophen 500 MG tablet Commonly known as:  TYLENOL Take 500 mg by mouth every 6 (six) hours as needed (for pain. (1 tablet scheduled at night w/tramadol)).   aspirin EC 81 MG tablet Take 81 mg by mouth daily.   aspirin 81 MG tablet Take by mouth.   atorvastatin 80 MG tablet Commonly known as:  LIPITOR Take 80 mg by mouth daily.   enalapril 10 MG tablet Commonly known as:  VASOTEC Take 10 mg by mouth daily.   fluticasone 50 MCG/ACT nasal spray Commonly known as:  FLONASE Place 1 spray into both nostrils daily as needed for allergies or rhinitis.   furosemide 40 MG tablet Commonly known as:  LASIX TAKE 1 TABLET BY MOUTH ONCE DAILY AS NEEDED FOR EDEMA.   metFORMIN 500 MG tablet Commonly known as:  GLUCOPHAGE Take  500 mg by mouth daily.   multivitamin with minerals Tabs tablet Take 1 tablet by mouth daily.   potassium chloride 10 MEQ tablet Commonly known as:  K-DUR Take by mouth.   traMADol 50 MG tablet Commonly known as:  ULTRAM Take 1-2 tablets (50-100 mg total) by mouth every 6 (six) hours as needed for severe pain.       Allergies:  Allergies  Allergen Reactions  . Sulfa Antibiotics Other (See Comments)    Childhood allergy    Family History: No family history on file.  Social History:  reports that he quit smoking about 20 years ago. He has never used smokeless tobacco. He reports that he drinks alcohol. He reports that he does not use drugs.  ROS: UROLOGY Frequent Urination?: No Hard to  postpone urination?: No Burning/pain with urination?: No Get up at night to urinate?: No Leakage of urine?: No Urine stream starts and stops?: Yes Trouble starting stream?: No Do you have to strain to urinate?: No Blood in urine?: No Urinary tract infection?: No Sexually transmitted disease?: No Injury to kidneys or bladder?: No Weak stream?: No Erection problems?: Yes Penile pain?: No  Gastrointestinal Nausea?: No Vomiting?: No Indigestion/heartburn?: No Diarrhea?: No Constipation?: No  Constitutional Fever: No Night sweats?: No Weight loss?: No Fatigue?: No  Skin Skin rash/lesions?: No Itching?: No  Eyes Blurred vision?: No Double vision?: No  Ears/Nose/Throat Sore throat?: No Sinus problems?: No  Hematologic/Lymphatic Swollen glands?: No Easy bruising?: No  Cardiovascular Leg swelling?: No Chest pain?: No  Respiratory Cough?: No Shortness of breath?: No  Endocrine Excessive thirst?: No  Musculoskeletal Back pain?: Yes Joint pain?: Yes  Neurological Headaches?: No Dizziness?: No  Psychologic Depression?: No Anxiety?: No  Physical Exam: BP 123/80   Pulse 80   Ht 5\' 9"  (1.753 m)   Wt 247 lb (112 kg)   BMI 36.48 kg/m   Constitutional:  Alert and oriented, No acute distress. HEENT:  AT, moist mucus membranes.  Trachea midline, no masses. Cardiovascular: No clubbing, cyanosis, or edema. Respiratory: Normal respiratory effort, no increased work of breathing. GI: Abdomen is soft, nontender, nondistended, no abdominal masses GU: No CVA tenderness 50 cc prostate with rubbery lateral lobes, no nodules. Skin: No rashes, bruises or suspicious lesions. Neurologic: Grossly intact, no focal deficits, moving all 4 extremities. Psychiatric: Normal mood and affect.  Laboratory Data: Lab Results  Component Value Date   WBC 9.2 02/09/2018   HGB 12.3 (L) 02/09/2018   HCT 36.1 (L) 02/09/2018   MCV 91.7 02/09/2018   PLT 223 02/09/2018    Lab  Results  Component Value Date   CREATININE 0.89 02/09/2018     Urinalysis Urinalysis reviewed today, 1+ leukocyte on dip, but no white blood cells, red blood cells, or any other suspicion for infection.  Nitrate negative.  Pertinent Imaging: N/A  Assessment & Plan:    1. Elevated PSA  We reviewed the implications of an elevated PSA and the uncertainty surrounding it. In general, a man's PSA increases with age and is produced by both normal and cancerous prostate tissue. Differential for elevated PSA is BPH, prostate cancer, infection, recent intercourse/ejaculation, prostate infarction, recent urethroscopic manipulation (foley placement/cystoscopy) and prostatitis. Management of an elevated PSA can include observation or prostate biopsy and wediscussed this in detail. We discussed that indications for prostate biopsy are defined by age and race specific PSA cutoffs as well as a PSA velocity of 0.75/year.  PSA repeated today.  Rectal exam is reassuring.  PSA may be appropriate for his age and size of gland.  Recommend continue to follow PSA/rectal exam annually and assess for overall trend.  He is agreeable this plan.  - PSA - Urinalysis, Complete - CULTURE, URINE COMPREHENSIVE  2. History of UTI UA negative today Not contributory to PSA  Return in about 1 year (around 07/07/2019) for PSA/ DRE.  Vanna Scotland, MD  [facility] [address], [address] [place], [place] [address] [phone]

## 2018-07-07 LAB — PSA: Prostate Specific Ag, Serum: 4.3 ng/mL — ABNORMAL HIGH (ref 0.0–4.0)

## 2018-07-08 LAB — CULTURE, URINE COMPREHENSIVE

## 2018-07-10 ENCOUNTER — Telehealth: Payer: Self-pay | Admitting: Urology

## 2018-07-10 NOTE — Telephone Encounter (Signed)
Pt Tyrone Bird stating that someone called him about his test results, no documentation about any results call. Pt asks for someone to call him back at [phone] as he has questions he'd like to ask. Please advise. Thanks.

## 2018-07-11 NOTE — Telephone Encounter (Signed)
No, its a low colony count of contaminant.  Tyrone ScotlandAshley Daniil Labarge, MD

## 2018-07-11 NOTE — Telephone Encounter (Signed)
Pt states he looked up his labs on Mychart and he wants to know based on his urine culture, should he be on an abx?

## 2018-07-12 NOTE — Telephone Encounter (Signed)
Pt informed

## 2019-05-14 IMAGING — CR DG CHEST 2V
2 series · 2 of 2 positions shown · non-contrast
Comparison: None

CLINICAL DATA: Preop evaluation for knee replacement surgery,
history hypertension, former smoker

EXAM:
CHEST  2 VIEW

[chest pa]
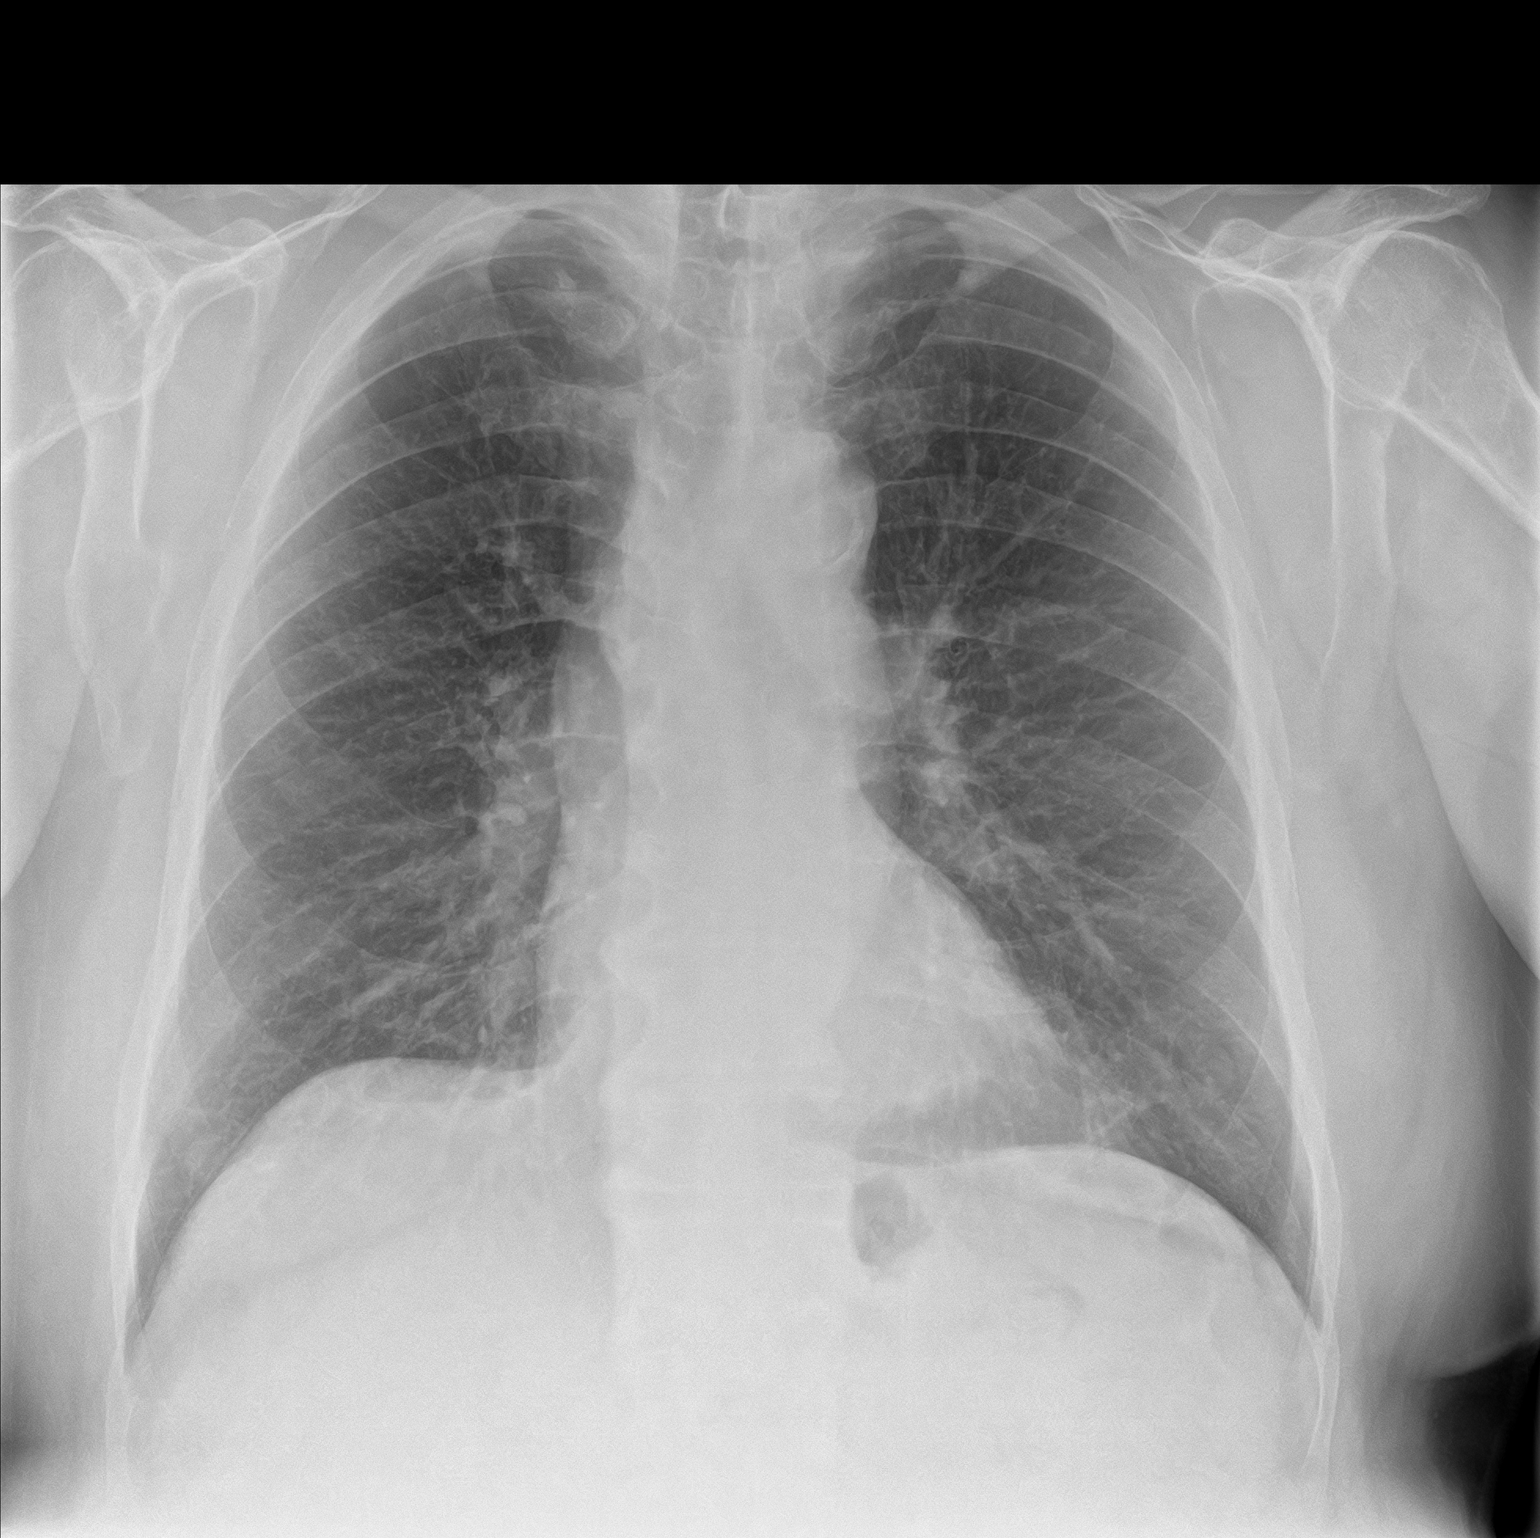

[chest lat]
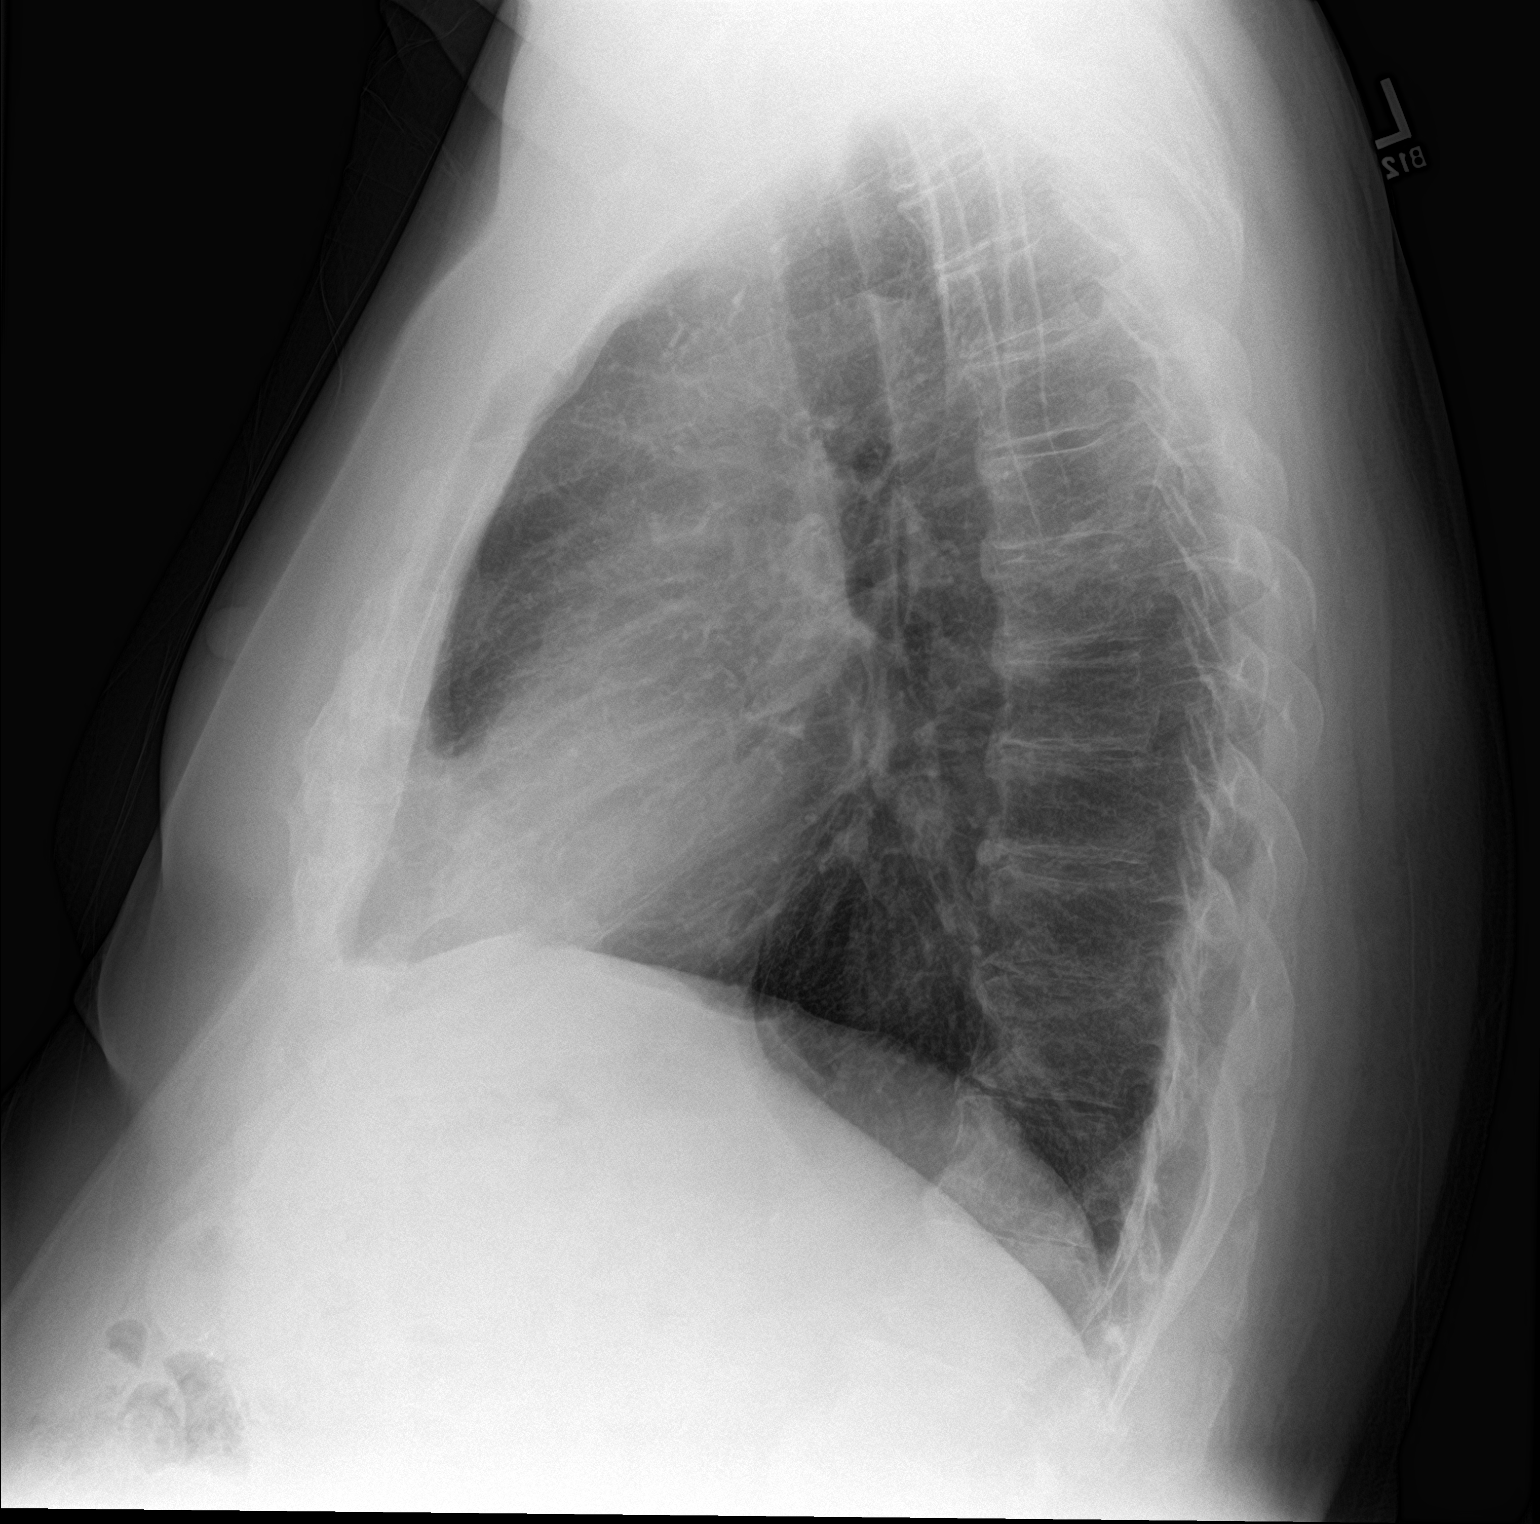

[2 of 2 positions shown; findings below may reference images not displayed]

FINDINGS: Normal heart size, mediastinal contours, and pulmonary vascularity.

Atherosclerotic calcification aorta.

Lungs clear.

No pleural effusion or pneumothorax.

LEFT nipple shadow on PA view, seen on lateral view as well.

Scattered degenerative disc disease changes thoracic spine and
osseous demineralization.
IMPRESSION: No acute abnormalities.

## 2019-07-02 ENCOUNTER — Ambulatory Visit: Payer: Self-pay | Admitting: Urology

## 2019-12-25 DIAGNOSIS — I10 Essential (primary) hypertension: Secondary | ICD-10-CM | POA: Diagnosis not present

## 2019-12-25 DIAGNOSIS — E785 Hyperlipidemia, unspecified: Secondary | ICD-10-CM | POA: Diagnosis not present

## 2019-12-25 DIAGNOSIS — I779 Disorder of arteries and arterioles, unspecified: Secondary | ICD-10-CM | POA: Diagnosis not present

## 2019-12-25 DIAGNOSIS — E118 Type 2 diabetes mellitus with unspecified complications: Secondary | ICD-10-CM | POA: Diagnosis not present

## 2019-12-25 DIAGNOSIS — E1169 Type 2 diabetes mellitus with other specified complication: Secondary | ICD-10-CM | POA: Diagnosis not present

## 2020-01-03 DIAGNOSIS — Z0000 Encounter for general adult medical examination without abnormal findings: Secondary | ICD-10-CM | POA: Diagnosis not present

## 2020-01-03 DIAGNOSIS — E118 Type 2 diabetes mellitus with unspecified complications: Secondary | ICD-10-CM | POA: Diagnosis not present

## 2020-01-03 DIAGNOSIS — G5601 Carpal tunnel syndrome, right upper limb: Secondary | ICD-10-CM | POA: Diagnosis not present

## 2020-01-03 DIAGNOSIS — I10 Essential (primary) hypertension: Secondary | ICD-10-CM | POA: Diagnosis not present

## 2020-01-03 DIAGNOSIS — I2510 Atherosclerotic heart disease of native coronary artery without angina pectoris: Secondary | ICD-10-CM | POA: Diagnosis not present

## 2020-01-03 DIAGNOSIS — E1169 Type 2 diabetes mellitus with other specified complication: Secondary | ICD-10-CM | POA: Diagnosis not present

## 2020-01-03 DIAGNOSIS — E785 Hyperlipidemia, unspecified: Secondary | ICD-10-CM | POA: Diagnosis not present

## 2020-01-15 DIAGNOSIS — Z1212 Encounter for screening for malignant neoplasm of rectum: Secondary | ICD-10-CM | POA: Diagnosis not present

## 2020-01-15 DIAGNOSIS — Z1211 Encounter for screening for malignant neoplasm of colon: Secondary | ICD-10-CM | POA: Diagnosis not present

## 2020-01-18 LAB — EXTERNAL GENERIC LAB PROCEDURE: COLOGUARD: NEGATIVE

## 2020-06-25 DIAGNOSIS — E118 Type 2 diabetes mellitus with unspecified complications: Secondary | ICD-10-CM | POA: Diagnosis not present

## 2020-06-25 DIAGNOSIS — E1169 Type 2 diabetes mellitus with other specified complication: Secondary | ICD-10-CM | POA: Diagnosis not present

## 2020-06-25 DIAGNOSIS — E785 Hyperlipidemia, unspecified: Secondary | ICD-10-CM | POA: Diagnosis not present

## 2020-06-25 DIAGNOSIS — I10 Essential (primary) hypertension: Secondary | ICD-10-CM | POA: Diagnosis not present

## 2020-07-02 DIAGNOSIS — I10 Essential (primary) hypertension: Secondary | ICD-10-CM | POA: Diagnosis not present

## 2020-07-02 DIAGNOSIS — I779 Disorder of arteries and arterioles, unspecified: Secondary | ICD-10-CM | POA: Diagnosis not present

## 2020-07-02 DIAGNOSIS — G5603 Carpal tunnel syndrome, bilateral upper limbs: Secondary | ICD-10-CM | POA: Diagnosis not present

## 2020-07-02 DIAGNOSIS — E785 Hyperlipidemia, unspecified: Secondary | ICD-10-CM | POA: Diagnosis not present

## 2020-07-02 DIAGNOSIS — E1169 Type 2 diabetes mellitus with other specified complication: Secondary | ICD-10-CM | POA: Diagnosis not present

## 2020-07-28 DIAGNOSIS — Z961 Presence of intraocular lens: Secondary | ICD-10-CM | POA: Diagnosis not present

## 2020-07-28 DIAGNOSIS — H43813 Vitreous degeneration, bilateral: Secondary | ICD-10-CM | POA: Diagnosis not present

## 2020-07-28 DIAGNOSIS — E119 Type 2 diabetes mellitus without complications: Secondary | ICD-10-CM | POA: Diagnosis not present

## 2020-07-30 ENCOUNTER — Encounter: Payer: Self-pay | Admitting: Physician Assistant

## 2020-07-30 ENCOUNTER — Other Ambulatory Visit: Payer: Self-pay

## 2020-07-30 ENCOUNTER — Emergency Department: Payer: Medicare HMO

## 2020-07-30 ENCOUNTER — Emergency Department
Admission: EM | Admit: 2020-07-30 | Discharge: 2020-07-30 | Disposition: A | Discharge status: Home / Self Care | Payer: Medicare HMO | Attending: Emergency Medicine | Admitting: Emergency Medicine

## 2020-07-30 DIAGNOSIS — Z23 Encounter for immunization: Secondary | ICD-10-CM | POA: Diagnosis not present

## 2020-07-30 DIAGNOSIS — Z79899 Other long term (current) drug therapy: Secondary | ICD-10-CM | POA: Insufficient documentation

## 2020-07-30 DIAGNOSIS — Z96651 Presence of right artificial knee joint: Secondary | ICD-10-CM | POA: Diagnosis not present

## 2020-07-30 DIAGNOSIS — S61411A Laceration without foreign body of right hand, initial encounter: Secondary | ICD-10-CM | POA: Insufficient documentation

## 2020-07-30 DIAGNOSIS — S0083XA Contusion of other part of head, initial encounter: Secondary | ICD-10-CM | POA: Diagnosis not present

## 2020-07-30 DIAGNOSIS — Y999 Unspecified external cause status: Secondary | ICD-10-CM | POA: Insufficient documentation

## 2020-07-30 DIAGNOSIS — S62616D Displaced fracture of proximal phalanx of right little finger, subsequent encounter for fracture with routine healing: Secondary | ICD-10-CM | POA: Diagnosis not present

## 2020-07-30 DIAGNOSIS — R58 Hemorrhage, not elsewhere classified: Secondary | ICD-10-CM | POA: Diagnosis not present

## 2020-07-30 DIAGNOSIS — Z87891 Personal history of nicotine dependence: Secondary | ICD-10-CM | POA: Diagnosis not present

## 2020-07-30 DIAGNOSIS — R0902 Hypoxemia: Secondary | ICD-10-CM | POA: Diagnosis not present

## 2020-07-30 DIAGNOSIS — Z7982 Long term (current) use of aspirin: Secondary | ICD-10-CM | POA: Diagnosis not present

## 2020-07-30 DIAGNOSIS — E119 Type 2 diabetes mellitus without complications: Secondary | ICD-10-CM | POA: Insufficient documentation

## 2020-07-30 DIAGNOSIS — S0031XA Abrasion of nose, initial encounter: Secondary | ICD-10-CM | POA: Diagnosis not present

## 2020-07-30 DIAGNOSIS — I959 Hypotension, unspecified: Secondary | ICD-10-CM | POA: Diagnosis not present

## 2020-07-30 DIAGNOSIS — S61216A Laceration without foreign body of right little finger without damage to nail, initial encounter: Secondary | ICD-10-CM | POA: Diagnosis not present

## 2020-07-30 DIAGNOSIS — S62616B Displaced fracture of proximal phalanx of right little finger, initial encounter for open fracture: Secondary | ICD-10-CM

## 2020-07-30 DIAGNOSIS — Y939 Activity, unspecified: Secondary | ICD-10-CM | POA: Diagnosis not present

## 2020-07-30 DIAGNOSIS — Y92481 Parking lot as the place of occurrence of the external cause: Secondary | ICD-10-CM | POA: Diagnosis not present

## 2020-07-30 DIAGNOSIS — S0081XA Abrasion of other part of head, initial encounter: Secondary | ICD-10-CM | POA: Diagnosis not present

## 2020-07-30 DIAGNOSIS — I10 Essential (primary) hypertension: Secondary | ICD-10-CM | POA: Insufficient documentation

## 2020-07-30 DIAGNOSIS — S62616A Displaced fracture of proximal phalanx of right little finger, initial encounter for closed fracture: Secondary | ICD-10-CM | POA: Diagnosis not present

## 2020-07-30 DIAGNOSIS — W010XXA Fall on same level from slipping, tripping and stumbling without subsequent striking against object, initial encounter: Secondary | ICD-10-CM | POA: Diagnosis not present

## 2020-07-30 DIAGNOSIS — S0990XA Unspecified injury of head, initial encounter: Secondary | ICD-10-CM | POA: Diagnosis present

## 2020-07-30 DIAGNOSIS — W19XXXA Unspecified fall, initial encounter: Secondary | ICD-10-CM | POA: Diagnosis not present

## 2020-07-30 MED ORDER — TETANUS-DIPHTH-ACELL PERTUSSIS 5-2.5-18.5 LF-MCG/0.5 IM SUSP
0.5000 mL | Freq: Once | INTRAMUSCULAR | Status: AC
  Administered 2020-07-30: 0.5 mL via INTRAMUSCULAR
  Filled 2020-07-30: qty 0.5

## 2020-07-30 MED ORDER — TRAMADOL HCL 50 MG PO TABS: 50.0000 mg | ORAL_TABLET | Freq: Two times a day (BID) | ORAL | 0 refills | Status: AC

## 2020-07-30 MED ORDER — DOXYCYCLINE MONOHYDRATE 50 MG PO CAPS: 100.0000 mg | ORAL_CAPSULE | Freq: Two times a day (BID) | ORAL | 0 refills | Status: AC

## 2020-07-30 MED ORDER — CEPHALEXIN 500 MG PO CAPS: 500.0000 mg | ORAL_CAPSULE | Freq: Three times a day (TID) | ORAL | 0 refills | Status: AC

## 2020-07-30 MED ORDER — TRAMADOL HCL 50 MG PO TABS
50.0000 mg | ORAL_TABLET | Freq: Once | ORAL | Status: AC
  Administered 2020-07-30: 50 mg via ORAL
  Filled 2020-07-30: qty 1

## 2020-07-30 MED ORDER — BACITRACIN-NEOMYCIN-POLYMYXIN 400-5-5000 EX OINT
TOPICAL_OINTMENT | Freq: Once | CUTANEOUS | Status: AC
  Administered 2020-07-30: 1 via TOPICAL
  Filled 2020-07-30: qty 1

## 2020-07-30 MED ORDER — LIDOCAINE HCL (PF) 1 % IJ SOLN
5.0000 mL | Freq: Once | INTRAMUSCULAR | Status: AC
  Administered 2020-07-30: 5 mL
  Filled 2020-07-30: qty 5

## 2020-07-30 MED ORDER — CEPHALEXIN 500 MG PO CAPS
500.0000 mg | ORAL_CAPSULE | Freq: Once | ORAL | Status: AC
  Administered 2020-07-30: 500 mg via ORAL
  Filled 2020-07-30: qty 1

## 2020-07-30 MED ORDER — DOXYCYCLINE HYCLATE 100 MG PO TABS
100.0000 mg | ORAL_TABLET | Freq: Once | ORAL | Status: AC
  Administered 2020-07-30: 100 mg via ORAL
  Filled 2020-07-30: qty 1

## 2020-07-30 NOTE — ED Triage Notes (Signed)
Patient tripped over a curb and fell. Patient has abrasion on forehead, across bridge of nose and right hand laceration. Patient is alert and oriented x4 and remembers event.

## 2020-07-30 NOTE — Discharge Instructions (Addendum)
Your CT scans of the face and head are negative for any acute findings.  Your x-ray of the right hand that revealed a fracture to the base of the pinky. Keep the wound clean and dry. Wear the splint until you are seen by Ortho-Hand specialist. Take the antibiotics as directed and the pain medicine as needed.

## 2020-07-30 NOTE — ED Notes (Signed)
Abrasions on forehead and nose were cleaned and neosporin applied. Laceration on right 5th finger was cleaned, non-adherent pad placed, sterile gauze in place and wrapped with kling. Patient tolerated procedure well.

## 2020-07-30 NOTE — ED Provider Notes (Signed)
[facility] Emergency Department Provider Note ____________________________________________  Time seen: 22  I have reviewed the triage vital signs and the nursing notes.  HISTORY  Chief Complaint  Fall  HPI Tyrone Bird is a 74 y.o. male presents to ED via EMS for evaluation of injury sustained following a mechanical fall.  Patient describes tripping over the curb, when he fell landing hitting his face on the parking bump stop.  He apparently fell with an outstretched right hand, and notes a flexion deformity to the right pinky.  He also has a laceration to the palm at the base of the right pinky.  He has an abrasion to his forehead and nose but denies any loss of consciousness, nausea, vomiting, weakness, or dizziness.  He also denies any chest pain, abdominal pain, or other injury related to his fall.  Patient is not on any blood thinners, and reports a medical history of diabetes, hypertension, and hyperlipidemia.  Past Medical History:  Diagnosis Date  . Arthritis   . Diabetes mellitus without complication (HCC)   . Elevated lipids   . Environmental and seasonal allergies   . Hypertension     Patient Active Problem List   Diagnosis Date Noted  . 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 KNEE ARTHROSCOPY    . TOTAL KNEE ARTHROPLASTY Right 02/08/2018   Procedure: TOTAL KNEE ARTHROPLASTY;  Surgeon: Christena Flake, MD;  Location: [facility] ORS;  Service: Orthopedics;  Laterality: Right;    Prior to Admission medications   Medication Sig Start Date End Date Taking? Authorizing Provider  acetaminophen (TYLENOL) 500 MG tablet Take 500 mg by mouth every 6 (six) hours as needed (for pain. (1 tablet scheduled at night w/tramadol)).     [provider]  aspirin 81 MG tablet Take by mouth. 05/21/15   [provider]  aspirin EC 81 MG tablet Take 81 mg by mouth daily.     [provider]  atorvastatin (LIPITOR) 80 MG tablet Take 80 mg by mouth daily.    [provider]  cephALEXin (KEFLEX) 500 MG capsule Take 1 capsule (500 mg total) by mouth 3 (three) times daily for 7 days. 07/30/20 08/06/20  Jovie Swanner, Charlesetta Ivory, PA-C  doxycycline (MONODOX) 50 MG capsule Take 2 capsules (100 mg total) by mouth 2 (two) times daily for 7 days. 07/30/20 08/06/20  Brannon Decaire, Charlesetta Ivory, PA-C  enalapril (VASOTEC) 10 MG tablet Take 10 mg by mouth daily.    [provider]  fluticasone (FLONASE) 50 MCG/ACT nasal spray Place 1 spray into both nostrils daily as needed for allergies or rhinitis.    [provider]  furosemide (LASIX) 40 MG tablet TAKE 1 TABLET BY MOUTH ONCE DAILY AS NEEDED FOR EDEMA. 05/17/18   [provider]  metFORMIN (GLUCOPHAGE) 500 MG tablet Take 500 mg by mouth daily.    [provider]  Multiple Vitamin (MULTIVITAMIN WITH MINERALS) TABS tablet Take 1 tablet by mouth daily.    [provider]  potassium chloride (K-DUR) 10 MEQ tablet Take by mouth. 05/17/18 05/17/19  [provider]  traMADol (ULTRAM) 50 MG tablet Take 1 tablet (50 mg total) by mouth 2 (two) times daily for 5 days. 07/30/20 08/04/20  Cletus Paris, Charlesetta Ivory, PA-C    Allergies Sulfa antibiotics  History reviewed. No pertinent family history.  Social History Social History   Tobacco Use  .  Smoking status: Former Smoker    Quit date: 01/31/1998    Years since quitting: 22.5  . Smokeless tobacco: Never Used  Vaping Use  . Vaping Use: Never used  Substance Use Topics  . Alcohol use: Not Currently    Comment: rare  . Drug use: No    Review of Systems  Constitutional: Negative for fever. Eyes: Negative for visual changes. ENT: Negative for sore throat. Cardiovascular: Negative for chest pain. Respiratory: Negative for shortness of breath. Gastrointestinal: Negative for abdominal pain, vomiting and  diarrhea. Genitourinary: Negative for dysuria. Musculoskeletal: Negative for back pain.  Right hand laceration and pinky deformity. Skin: Negative for rash.  Facial abrasions as above. Neurological: Negative for headaches, focal weakness or numbness. ____________________________________________  PHYSICAL EXAM:  VITAL SIGNS: ED Triage Vitals  Enc Vitals Group     BP 07/30/20 1550 118/72     Pulse Rate 07/30/20 1550 71     Resp 07/30/20 1550 16     Temp 07/30/20 1550 98.5 F (36.9 C)     Temp Source 07/30/20 1550 Oral     SpO2 07/30/20 1550 96 %     Weight 07/30/20 1556 254 lb (115.2 kg)     Height 07/30/20 1556 5\' 10"  (1.778 m)     Head Circumference --      Peak Flow --      Pain Score 07/30/20 1555 4     Pain Loc --      Pain Edu? --      Excl. in GC? --     Constitutional: Alert and oriented. Well appearing and in no distress. Head: Normocephalic and atraumatic, except for abrasions over the right forehead and brow, and a superficial lack over the nasal bridge.  Eyes: Conjunctivae are normal. PERRL. Normal extraocular movements Ears: Canals clear. TMs intact bilaterally. Nose: No congestion/rhinorrhea/epistaxis. No deformity.  Mouth/Throat: Mucous membranes are moist. No oral lesions or dental injury.  Neck: Supple.  Normal range of motion without crepitus.  No midline tenderness is appreciated. Cardiovascular: Normal rate, regular rhythm. Normal distal pulses. Respiratory: Normal respiratory effort. No wheezes/rales/rhonchi. Gastrointestinal: Soft and nontender. No distention. Musculoskeletal: Normal composite fist on the right hand.  Patient does have a laceration across the palmar fifth MCP.  He also has an extension deficit on the right pinky.  The finger is in a flexed position at rest.  Nontender with normal range of motion in all extremities.  Neurologic: Cranial nerves II through XII grossly intact.  Normal gross sensation intact. Normal speech and language. No gross  focal neurologic deficits are appreciated. Skin:  Skin is warm, dry and intact. No rash noted. Psychiatric: Mood and affect are normal. Patient exhibits appropriate insight and judgment. ____________________________________________   RADIOLOGY  DG Right Hand IMPRESSION: Mildly comminuted, displaced and angulated fracture involving the fifth proximal phalangeal base with likely intra-articular extension to the fifth metacarpophalangeal joint.  Chronic deformity of fifth metacarpal.  Mild arthrosis of the and wrist as above.  CT Maxillofacial / Head w/o CM IMPRESSION: CT head:  1. No evidence of acute intracranial abnormality. 2. Nonspecific 11 mm somewhat rounded focus of dural-based calcification along the right frontoparietal convexity. A small calcified meningioma at this site is difficult to exclude. 3. 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 4. Mild generalized parenchymal atrophy.  CT maxillofacial:  1. No evidence of acute maxillofacial fracture. 2. Subtle forehead soft tissue swelling is questioned. 3. Mild left maxillary sinus mucosal thickening. ____________________________________________  PROCEDURES  Tdap 0.5 mL IM Keflex 500 mg PO Doxycycline 100 mg PO   .Marland Kitchen.Laceration Repair  Date/Time: 07/30/2020 4:36 PM Performed by: Lissa HoardMenshew, Jalena Vanderlinden V Bacon, PA-C Authorized by: Lissa HoardMenshew, Zerick Prevette V Bacon, PA-C   Consent:    Consent obtained:  Verbal   Consent given by:  Patient   Risks discussed:  Pain Anesthesia (see MAR for exact dosages):    Anesthesia method:  Local infiltration   Local anesthetic:  Lidocaine 1% w/o epi Laceration details:    Location:  Finger   Finger location:  R small finger   Length (cm):  2 Repair type:    Repair type:  Simple Pre-procedure details:    Preparation:  Patient was prepped and draped in usual sterile fashion Exploration:    Wound extent: underlying fracture     Contaminated: no    Treatment:    Area cleansed with:  Betadine and saline   Amount of cleaning:  Extensive   Irrigation solution:  Sterile saline   Irrigation method:  Syringe Skin repair:    Repair method:  Sutures   Suture size:  5-0   Suture material:  Nylon   Suture technique:  Simple interrupted   Number of sutures: 8+ Approximation:    Approximation:  Close Post-procedure details:    Dressing:  Non-adherent dressing and splint for protection   Patient tolerance of procedure:  Tolerated well, no immediate complications  Reduction of fracture  Date/Time: 07/30/2020 4:22 PM Performed by: Lissa HoardMenshew, Prestyn Stanco V Bacon, PA-C Authorized by: Lissa HoardMenshew, Sheila Ocasio V Bacon, PA-C  Preparation: Patient was prepped and draped in the usual sterile fashion. Local anesthesia used: yes Anesthesia: local infiltration  Anesthesia: Local anesthesia used: yes Local Anesthetic: lidocaine 1% with epinephrine  Sedation: Patient sedated: no  Patient tolerance: patient tolerated the procedure well with no immediate complications Comments: Manual reduction of a displaced, comminuted, dorsally angulated proximal phalanx fracture of the 5th digit.   Marland Kitchen.Splint Application  Date/Time: 07/30/2020 5:05 PM Performed by: Lissa HoardMenshew, Kashawn Manzano V Bacon, PA-C Authorized by: Lissa HoardMenshew, Tyeshia Cornforth V Bacon, PA-C   Consent:    Consent obtained:  Verbal   Consent given by:  Patient   Risks discussed:  Pain Pre-procedure details:    Sensation:  Normal Procedure details:    Laterality:  Right   Location:  Finger   Finger:  R small finger   Strapping: no     Splint type:  Ulnar gutter   Supplies:  Elastic bandage and cotton padding Post-procedure details:    Pain:  Improved   Sensation:  Normal   Patient tolerance of procedure:  Tolerated well, no immediate complications  ____________________________________________  INITIAL IMPRESSION / ASSESSMENT AND PLAN / ED COURSE  Patient with ED evaluation of injury sustained following  mechanical fall.  He was found to have abrasions to the face but CT imaging of the head and face were negative.  He also presented with pain and disability to the right hand and pinky.  X-ray reveals a comminuted fracture of the proximal phalanx with dorsal angulation.  Patient also had a laceration to the palmar MCP consistent with mechanism.  The palmar wound was repaired using nylon sutures, after the Bactrim was reduced manually.  The patient was placed in an ulnar gutter OCL splint and discharged with wound care instructions and supplies.  He will follow-up with Ortho-hand for further fracture care.  Prescriptions for doxycycline, Keflex, and Ultram are provided for wound prophylaxis and pain, respectively.  He is advised to  return to the ED immediately for signs of worsening pain, swelling, or infection.  DONN ZANETTI was evaluated in Emergency Department on 07/30/2020 for the symptoms described in the history of present illness. He was evaluated in the context of the global COVID-19 pandemic, which necessitated consideration that the patient might be at risk for infection with the SARS-CoV-2 virus that causes COVID-19. Institutional protocols and algorithms that pertain to the evaluation of patients at risk for COVID-19 are in a state of rapid change based on information released by regulatory bodies including the CDC and federal and state organizations. These policies and algorithms were followed during the patient's care in the ED. ____________________________________________  FINAL CLINICAL IMPRESSION(S) / ED DIAGNOSES  Final diagnoses:  Contusion of face, initial encounter  Facial abrasion, initial encounter  Laceration of right hand without foreign body, initial encounter  Open displaced fracture of proximal phalanx of right little finger, initial encounter      Lissa Hoard, PA-C 07/30/20 1907    Arnaldo Natal, MD 07/31/20 0041

## 2020-07-30 NOTE — ED Notes (Signed)
Patient declined discharge vital signs. 

## 2020-08-06 DIAGNOSIS — S62616A Displaced fracture of proximal phalanx of right little finger, initial encounter for closed fracture: Secondary | ICD-10-CM | POA: Diagnosis not present

## 2020-08-06 DIAGNOSIS — S62616B Displaced fracture of proximal phalanx of right little finger, initial encounter for open fracture: Secondary | ICD-10-CM | POA: Diagnosis not present

## 2020-08-14 DIAGNOSIS — S62616B Displaced fracture of proximal phalanx of right little finger, initial encounter for open fracture: Secondary | ICD-10-CM | POA: Diagnosis not present

## 2020-08-28 DIAGNOSIS — S62616B Displaced fracture of proximal phalanx of right little finger, initial encounter for open fracture: Secondary | ICD-10-CM | POA: Diagnosis not present

## 2020-09-01 DIAGNOSIS — S62616D Displaced fracture of proximal phalanx of right little finger, subsequent encounter for fracture with routine healing: Secondary | ICD-10-CM | POA: Diagnosis not present

## 2020-09-10 DIAGNOSIS — S62616D Displaced fracture of proximal phalanx of right little finger, subsequent encounter for fracture with routine healing: Secondary | ICD-10-CM | POA: Diagnosis not present

## 2020-09-24 DIAGNOSIS — S62616D Displaced fracture of proximal phalanx of right little finger, subsequent encounter for fracture with routine healing: Secondary | ICD-10-CM | POA: Diagnosis not present

## 2020-09-30 DIAGNOSIS — M7541 Impingement syndrome of right shoulder: Secondary | ICD-10-CM | POA: Diagnosis not present

## 2020-09-30 DIAGNOSIS — S62616B Displaced fracture of proximal phalanx of right little finger, initial encounter for open fracture: Secondary | ICD-10-CM | POA: Diagnosis not present

## 2020-10-08 DIAGNOSIS — S62616D Displaced fracture of proximal phalanx of right little finger, subsequent encounter for fracture with routine healing: Secondary | ICD-10-CM | POA: Diagnosis not present

## 2020-10-14 DIAGNOSIS — M25511 Pain in right shoulder: Secondary | ICD-10-CM | POA: Diagnosis not present

## 2020-10-22 DIAGNOSIS — S62616D Displaced fracture of proximal phalanx of right little finger, subsequent encounter for fracture with routine healing: Secondary | ICD-10-CM | POA: Diagnosis not present

## 2020-10-27 DIAGNOSIS — M25511 Pain in right shoulder: Secondary | ICD-10-CM | POA: Diagnosis not present

## 2020-10-28 DIAGNOSIS — M7541 Impingement syndrome of right shoulder: Secondary | ICD-10-CM | POA: Diagnosis not present

## 2020-10-28 DIAGNOSIS — S62616B Displaced fracture of proximal phalanx of right little finger, initial encounter for open fracture: Secondary | ICD-10-CM | POA: Diagnosis not present

## 2020-10-28 DIAGNOSIS — S62616D Displaced fracture of proximal phalanx of right little finger, subsequent encounter for fracture with routine healing: Secondary | ICD-10-CM | POA: Diagnosis not present

## 2020-11-03 DIAGNOSIS — M25511 Pain in right shoulder: Secondary | ICD-10-CM | POA: Diagnosis not present

## 2020-11-05 DIAGNOSIS — S62616D Displaced fracture of proximal phalanx of right little finger, subsequent encounter for fracture with routine healing: Secondary | ICD-10-CM | POA: Diagnosis not present

## 2020-11-10 DIAGNOSIS — M25511 Pain in right shoulder: Secondary | ICD-10-CM | POA: Diagnosis not present

## 2020-11-17 DIAGNOSIS — M25511 Pain in right shoulder: Secondary | ICD-10-CM | POA: Diagnosis not present

## 2020-11-24 DIAGNOSIS — S62616D Displaced fracture of proximal phalanx of right little finger, subsequent encounter for fracture with routine healing: Secondary | ICD-10-CM | POA: Diagnosis not present

## 2020-12-25 DIAGNOSIS — G5601 Carpal tunnel syndrome, right upper limb: Secondary | ICD-10-CM | POA: Diagnosis not present

## 2021-01-01 DIAGNOSIS — I10 Essential (primary) hypertension: Secondary | ICD-10-CM | POA: Diagnosis not present

## 2021-01-01 DIAGNOSIS — E118 Type 2 diabetes mellitus with unspecified complications: Secondary | ICD-10-CM | POA: Diagnosis not present

## 2021-01-14 DIAGNOSIS — G5603 Carpal tunnel syndrome, bilateral upper limbs: Secondary | ICD-10-CM | POA: Diagnosis not present

## 2021-01-15 DIAGNOSIS — G5601 Carpal tunnel syndrome, right upper limb: Secondary | ICD-10-CM | POA: Diagnosis not present

## 2021-01-22 ENCOUNTER — Other Ambulatory Visit: Payer: Self-pay | Admitting: Orthopedic Surgery

## 2021-01-26 ENCOUNTER — Encounter
Admission: RE | Admit: 2021-01-26 | Discharge: 2021-01-26 | Disposition: A | Discharge status: Home / Self Care | Payer: Medicare HMO | Source: Ambulatory Visit | Attending: Orthopedic Surgery | Admitting: Orthopedic Surgery

## 2021-01-26 DIAGNOSIS — I779 Disorder of arteries and arterioles, unspecified: Secondary | ICD-10-CM | POA: Diagnosis not present

## 2021-01-26 DIAGNOSIS — E785 Hyperlipidemia, unspecified: Secondary | ICD-10-CM | POA: Diagnosis not present

## 2021-01-26 DIAGNOSIS — Z01812 Encounter for preprocedural laboratory examination: Secondary | ICD-10-CM | POA: Insufficient documentation

## 2021-01-26 DIAGNOSIS — I10 Essential (primary) hypertension: Secondary | ICD-10-CM | POA: Insufficient documentation

## 2021-01-26 DIAGNOSIS — E119 Type 2 diabetes mellitus without complications: Secondary | ICD-10-CM | POA: Insufficient documentation

## 2021-01-26 DIAGNOSIS — Z0000 Encounter for general adult medical examination without abnormal findings: Secondary | ICD-10-CM | POA: Diagnosis not present

## 2021-01-26 DIAGNOSIS — E1169 Type 2 diabetes mellitus with other specified complication: Secondary | ICD-10-CM | POA: Diagnosis not present

## 2021-01-26 HISTORY — DX: Pneumonia, unspecified organism: J18.9

## 2021-01-26 HISTORY — DX: Personal history of urinary calculi: Z87.442

## 2021-01-26 NOTE — Patient Instructions (Signed)
Your procedure is scheduled on: 02/02/21- TUESDAY Report to the Registration Desk on the 1st floor of the [facility]. To find out your arrival time, please call [phone] between 1PM - 3PM on: 02/01/21- MONDAY  REMEMBER: Instructions that are not followed completely may result in serious medical risk, up to and including death; or upon the discretion of your surgeon and anesthesiologist your surgery may need to be rescheduled.  Do not eat food after midnight the night before surgery.  No gum chewing, lozengers or hard candies.  You may however, drink CLEAR liquids up to 2 hours before you are scheduled to arrive for your surgery. Do not drink anything within 2 hours of your scheduled arrival time. Type 1 and Type 2 diabetics should only drink water.   TAKE THESE MEDICATIONS THE MORNING OF SURGERY WITH A SIP OF WATER: NONE.  Stop Metformin 2 days prior to surgery. DO NOT TAKE ON 02/13, 02/14, DO NOT TAKE THE MORNING OF SURGERY.  One week prior to surgery: STOP TAKING ON 01/26/21-  Stop Anti-inflammatories (NSAIDS) such as Advil, Aleve, Ibuprofen, Motrin, Naproxen, Naprosyn and Aspirin based products such as Excedrin, Goodys Powder, BC Powder. MAY TAKE TYLENOL IF NEEDED AS DIRECTED.  Stop ANY OVER THE COUNTER supplements until after surgery. (However, you may continue taking Vitamin D, Vitamin B, and multivitamin up until the day before surgery.)  No Alcohol for 24 hours before or after surgery.  No Smoking including e-cigarettes for 24 hours prior to surgery.  No chewable tobacco products for at least 6 hours prior to surgery.  No nicotine patches on the day of surgery.  Do not use any "recreational" drugs for at least a week prior to your surgery.  Please be advised that the combination of cocaine and anesthesia may have negative outcomes, up to and including death. If you test positive for cocaine, your surgery will be cancelled.  On the morning of surgery brush your teeth  with toothpaste and water, you may rinse your mouth with mouthwash if you wish. Do not swallow any toothpaste or mouthwash.  Do not wear jewelry, make-up, hairpins, clips or nail polish.  Do not wear lotions, powders, or perfumes.   Do not shave body from the neck down 48 hours prior to surgery just in case you cut yourself which could leave a site for infection.  Also, freshly shaved skin may become irritated if using the CHG soap.  Contact lenses, hearing aids and dentures may not be worn into surgery.  Do not bring valuables to the hospital. [facility] is not responsible for any missing/lost belongings or valuables.   Use CHG Soap or wipes as directed on instruction sheet.  Notify your doctor if there is any change in your medical condition (cold, fever, infection).  Wear comfortable clothing (specific to your surgery type) to the hospital.  Plan for stool softeners for home use; pain medications have a tendency to cause constipation. You can also help prevent constipation by eating foods high in fiber such as fruits and vegetables and drinking plenty of fluids as your diet allows.  After surgery, you can help prevent lung complications by doing breathing exercises.  Take deep breaths and cough every 1-2 hours. Your doctor may order a device called an Incentive Spirometer to help you take deep breaths. When coughing or sneezing, hold a pillow firmly against your incision with both hands. This is called "splinting." Doing this helps protect your incision. It also decreases belly discomfort.  If  you are being admitted to the hospital overnight, leave your suitcase in the car. After surgery it may be brought to your room.  If you are being discharged the day of surgery, you will not be allowed to drive home. You will need a responsible adult (18 years or older) to drive you home and stay with you that night.   If you are taking public transportation, you will need to have a  responsible adult (18 years or older) with you. Please confirm with your physician that it is acceptable to use public transportation.   Please call the Pre-admissions Testing Dept. at [phone] if you have any questions about these instructions.  Visitation Policy:  Patients undergoing a surgery or procedure may have one family member or support person with them as long as that person is not COVID-19 positive or experiencing its symptoms.  That person may remain in the waiting area during the procedure.  Inpatient Visitation:    Visiting hours are 7 a.m. to 8 p.m. Patients will be allowed one visitor. The visitor may change daily. The visitor must pass COVID-19 screenings, use hand sanitizer when entering and exiting the patient's room and wear a mask at all times, including in the patient's room. Patients must also wear a mask when staff or their visitor are in the room. Masking is required regardless of vaccination status. Systemwide, no visitors 17 or younger.

## 2021-01-29 ENCOUNTER — Other Ambulatory Visit
Admission: RE | Admit: 2021-01-29 | Discharge: 2021-01-29 | Disposition: A | Discharge status: Home / Self Care | Payer: Medicare HMO | Source: Ambulatory Visit | Attending: Orthopedic Surgery | Admitting: Orthopedic Surgery

## 2021-01-29 ENCOUNTER — Other Ambulatory Visit: Payer: Self-pay

## 2021-01-29 DIAGNOSIS — Z20822 Contact with and (suspected) exposure to covid-19: Secondary | ICD-10-CM | POA: Insufficient documentation

## 2021-01-29 DIAGNOSIS — Z01810 Encounter for preprocedural cardiovascular examination: Secondary | ICD-10-CM | POA: Diagnosis not present

## 2021-01-29 DIAGNOSIS — Z01818 Encounter for other preprocedural examination: Secondary | ICD-10-CM | POA: Diagnosis not present

## 2021-01-29 LAB — CBC
HCT: 42.2 % (ref 39.0–52.0)
Hemoglobin: 14.7 g/dL (ref 13.0–17.0)
MCH: 31.5 pg (ref 26.0–34.0)
MCHC: 34.8 g/dL (ref 30.0–36.0)
MCV: 90.4 fL (ref 80.0–100.0)
Platelets: 259 10*3/uL (ref 150–400)
RBC: 4.67 MIL/uL (ref 4.22–5.81)
RDW: 12.8 % (ref 11.5–15.5)
WBC: 7.7 10*3/uL (ref 4.0–10.5)
nRBC: 0 % (ref 0.0–0.2)

## 2021-01-29 LAB — BASIC METABOLIC PANEL
Anion gap: 12 (ref 5–15)
BUN: 15 mg/dL (ref 8–23)
CO2: 25 mmol/L (ref 22–32)
Calcium: 9.5 mg/dL (ref 8.9–10.3)
Chloride: 101 mmol/L (ref 98–111)
Creatinine, Ser: 1.07 mg/dL (ref 0.61–1.24)
GFR, Estimated: >60 mL/min (ref >60)
Glucose, Bld: 240 mg/dL — ABNORMAL HIGH (ref 70–99)
Potassium: 4.2 mmol/L (ref 3.5–5.1)
Sodium: 138 mmol/L (ref 135–145)

## 2021-01-29 LAB — PROTIME-INR
INR: 1.1 (ref 0.8–1.2)
Prothrombin Time: 13.4 seconds (ref 11.4–15.2)

## 2021-01-29 LAB — APTT: aPTT: 26 seconds (ref 24–36)

## 2021-01-30 LAB — SARS CORONAVIRUS 2 (TAT 6-24 HRS): SARS Coronavirus 2: NEGATIVE

## 2021-02-01 MED ORDER — ACETAMINOPHEN 500 MG PO TABS: 1000.0000 mg | ORAL_TABLET | ORAL | Status: AC

## 2021-02-01 MED ORDER — FAMOTIDINE 20 MG PO TABS: 20.0000 mg | ORAL_TABLET | Freq: Once | ORAL | Status: AC

## 2021-02-01 MED ORDER — SODIUM CHLORIDE 0.9 % IV SOLN: INTRAVENOUS | Status: DC

## 2021-02-01 MED ORDER — CEFAZOLIN SODIUM-DEXTROSE 2-4 GM/100ML-% IV SOLN
2.0000 g | INTRAVENOUS | Status: AC
  Administered 2021-02-02: 2 g via INTRAVENOUS

## 2021-02-02 ENCOUNTER — Encounter: Payer: Self-pay | Admitting: Orthopedic Surgery

## 2021-02-02 ENCOUNTER — Other Ambulatory Visit: Payer: Self-pay

## 2021-02-02 ENCOUNTER — Encounter
Admission: RE | Disposition: A | Discharge status: Home / Self Care | Payer: Medicare HMO | Source: Home / Self Care | Attending: Orthopedic Surgery

## 2021-02-02 ENCOUNTER — Ambulatory Visit
Admission: RE | Admit: 2021-02-02 | Discharge: 2021-02-02 | Disposition: A | Discharge status: Home / Self Care | Payer: Medicare HMO | Attending: Orthopedic Surgery | Admitting: Orthopedic Surgery

## 2021-02-02 ENCOUNTER — Ambulatory Visit: Payer: Medicare HMO | Admitting: Urgent Care

## 2021-02-02 DIAGNOSIS — I10 Essential (primary) hypertension: Secondary | ICD-10-CM | POA: Diagnosis not present

## 2021-02-02 DIAGNOSIS — Z79899 Other long term (current) drug therapy: Secondary | ICD-10-CM | POA: Diagnosis not present

## 2021-02-02 DIAGNOSIS — Z882 Allergy status to sulfonamides status: Secondary | ICD-10-CM | POA: Diagnosis not present

## 2021-02-02 DIAGNOSIS — Z87891 Personal history of nicotine dependence: Secondary | ICD-10-CM | POA: Diagnosis not present

## 2021-02-02 DIAGNOSIS — E785 Hyperlipidemia, unspecified: Secondary | ICD-10-CM | POA: Diagnosis not present

## 2021-02-02 DIAGNOSIS — Z96651 Presence of right artificial knee joint: Secondary | ICD-10-CM | POA: Diagnosis not present

## 2021-02-02 DIAGNOSIS — G5601 Carpal tunnel syndrome, right upper limb: Secondary | ICD-10-CM | POA: Diagnosis not present

## 2021-02-02 DIAGNOSIS — Z7984 Long term (current) use of oral hypoglycemic drugs: Secondary | ICD-10-CM | POA: Diagnosis not present

## 2021-02-02 DIAGNOSIS — E119 Type 2 diabetes mellitus without complications: Secondary | ICD-10-CM | POA: Diagnosis not present

## 2021-02-02 HISTORY — PX: CARPAL TUNNEL RELEASE: SHX101

## 2021-02-02 LAB — GLUCOSE, CAPILLARY
Glucose-Capillary: 159 mg/dL — ABNORMAL HIGH (ref 70–99)
Glucose-Capillary: 139 mg/dL — ABNORMAL HIGH (ref 70–99)

## 2021-02-02 SURGERY — CARPAL TUNNEL RELEASE
Anesthesia: General | Laterality: Right

## 2021-02-02 MED ORDER — OXYCODONE HCL 5 MG PO TABS: 5.0000 mg | ORAL_TABLET | ORAL | 0 refills | Status: AC | PRN

## 2021-02-02 MED ORDER — ONDANSETRON HCL 4 MG PO TABS: 4.0000 mg | ORAL_TABLET | Freq: Three times a day (TID) | ORAL | 0 refills | Status: AC | PRN

## 2021-02-02 MED ORDER — ONDANSETRON HCL 4 MG/2ML IJ SOLN: 4.0000 mg | Freq: Once | INTRAMUSCULAR | Status: DC | PRN

## 2021-02-02 MED ORDER — ORAL CARE MOUTH RINSE: 15.0000 mL | Freq: Once | OROMUCOSAL | Status: AC

## 2021-02-02 MED ORDER — DEXAMETHASONE SODIUM PHOSPHATE 10 MG/ML IJ SOLN
INTRAMUSCULAR | Status: AC
  Filled 2021-02-02: qty 1

## 2021-02-02 MED ORDER — ONDANSETRON HCL 4 MG/2ML IJ SOLN
INTRAMUSCULAR | Status: AC
  Filled 2021-02-02: qty 2

## 2021-02-02 MED ORDER — DEXAMETHASONE SODIUM PHOSPHATE 10 MG/ML IJ SOLN
INTRAMUSCULAR | Status: DC | PRN
  Administered 2021-02-02: 5 mg via INTRAVENOUS

## 2021-02-02 MED ORDER — LIDOCAINE HCL (PF) 2 % IJ SOLN
INTRAMUSCULAR | Status: AC
  Filled 2021-02-02: qty 5

## 2021-02-02 MED ORDER — NEOMYCIN-POLYMYXIN B GU 40-200000 IR SOLN
Status: DC | PRN
  Administered 2021-02-02: 2 mL

## 2021-02-02 MED ORDER — FAMOTIDINE 20 MG PO TABS
ORAL_TABLET | ORAL | Status: AC
  Administered 2021-02-02: 20 mg via ORAL
  Filled 2021-02-02: qty 1

## 2021-02-02 MED ORDER — FENTANYL CITRATE (PF) 100 MCG/2ML IJ SOLN
25.0000 ug | INTRAMUSCULAR | Status: DC | PRN
Start: 2021-02-02 — End: 2021-02-02
  Administered 2021-02-02 (×3): 25 ug via INTRAVENOUS

## 2021-02-02 MED ORDER — CEFAZOLIN SODIUM-DEXTROSE 2-4 GM/100ML-% IV SOLN
INTRAVENOUS | Status: AC
  Filled 2021-02-02: qty 100

## 2021-02-02 MED ORDER — CHLORHEXIDINE GLUCONATE CLOTH 2 % EX PADS: 6.0000 | MEDICATED_PAD | Freq: Once | CUTANEOUS | Status: DC

## 2021-02-02 MED ORDER — CHLORHEXIDINE GLUCONATE 0.12 % MT SOLN
OROMUCOSAL | Status: AC
  Administered 2021-02-02: 15 mL via OROMUCOSAL
  Filled 2021-02-02: qty 15

## 2021-02-02 MED ORDER — FENTANYL CITRATE (PF) 100 MCG/2ML IJ SOLN
INTRAMUSCULAR | Status: AC
  Administered 2021-02-02: 25 ug via INTRAVENOUS
  Filled 2021-02-02: qty 2

## 2021-02-02 MED ORDER — FENTANYL CITRATE (PF) 100 MCG/2ML IJ SOLN
INTRAMUSCULAR | Status: DC | PRN
  Administered 2021-02-02: 50 ug via INTRAVENOUS
  Administered 2021-02-02 (×2): 25 ug via INTRAVENOUS

## 2021-02-02 MED ORDER — ACETAMINOPHEN 500 MG PO TABS
ORAL_TABLET | ORAL | Status: AC
  Administered 2021-02-02: 1000 mg via ORAL
  Filled 2021-02-02: qty 2

## 2021-02-02 MED ORDER — NEOMYCIN-POLYMYXIN B GU 40-200000 IR SOLN
Status: AC
  Filled 2021-02-02: qty 40

## 2021-02-02 MED ORDER — ONDANSETRON HCL 4 MG/2ML IJ SOLN
INTRAMUSCULAR | Status: DC | PRN
  Administered 2021-02-02: 4 mg via INTRAVENOUS

## 2021-02-02 MED ORDER — PROPOFOL 10 MG/ML IV BOLUS
INTRAVENOUS | Status: DC | PRN
  Administered 2021-02-02: 200 mg via INTRAVENOUS

## 2021-02-02 MED ORDER — PROPOFOL 10 MG/ML IV BOLUS
INTRAVENOUS | Status: AC
  Filled 2021-02-02: qty 20

## 2021-02-02 MED ORDER — LIDOCAINE HCL (CARDIAC) PF 100 MG/5ML IV SOSY
PREFILLED_SYRINGE | INTRAVENOUS | Status: DC | PRN
  Administered 2021-02-02: 50 mg via INTRAVENOUS

## 2021-02-02 MED ORDER — CHLORHEXIDINE GLUCONATE CLOTH 2 % EX PADS
6.0000 | MEDICATED_PAD | Freq: Once | CUTANEOUS | Status: AC
  Administered 2021-02-02: 6 via TOPICAL

## 2021-02-02 MED ORDER — FENTANYL CITRATE (PF) 100 MCG/2ML IJ SOLN
INTRAMUSCULAR | Status: AC
  Filled 2021-02-02: qty 2

## 2021-02-02 MED ORDER — GLYCOPYRROLATE 0.2 MG/ML IJ SOLN
INTRAMUSCULAR | Status: AC
  Filled 2021-02-02: qty 1

## 2021-02-02 MED ORDER — BUPIVACAINE HCL (PF) 0.25 % IJ SOLN
INTRAMUSCULAR | Status: AC
  Filled 2021-02-02: qty 30

## 2021-02-02 MED ORDER — CHLORHEXIDINE GLUCONATE 0.12 % MT SOLN: 15.0000 mL | Freq: Once | OROMUCOSAL | Status: AC

## 2021-02-02 SURGICAL SUPPLY — 44 items
BLADE SURG MINI STRL (BLADE) ×2 IMPLANT
BNDG CMPR STD VLCR NS LF 5.8X4 (GAUZE/BANDAGES/DRESSINGS) ×2
BNDG ELASTIC 4X5.8 VLCR NS LF (GAUZE/BANDAGES/DRESSINGS) ×4 IMPLANT
BNDG ESMARK 4X12 TAN STRL LF (GAUZE/BANDAGES/DRESSINGS) ×2 IMPLANT
CANISTER SUCT 1200ML W/VALVE (MISCELLANEOUS) ×2 IMPLANT
CORD BIP STRL DISP 12FT (MISCELLANEOUS) ×2 IMPLANT
COVER WAND RF STERILE (DRAPES) ×2 IMPLANT
CUFF TOURN SGL QUICK 18X4 (TOURNIQUET CUFF) ×2 IMPLANT
DRAPE SPLIT 6X30 W/TAPE (DRAPES) ×2 IMPLANT
DRAPE SURG 17X11 SM STRL (DRAPES) ×2 IMPLANT
DRSG GAUZE FLUFF 36X18 (GAUZE/BANDAGES/DRESSINGS) ×2 IMPLANT
DURAPREP 26ML APPLICATOR (WOUND CARE) ×2 IMPLANT
ELECT REM PT RETURN 9FT ADLT (ELECTROSURGICAL) ×2
ELECTRODE REM PT RTRN 9FT ADLT (ELECTROSURGICAL) ×1 IMPLANT
FORCEPS JEWEL BIP 4-3/4 STR (INSTRUMENTS) ×2 IMPLANT
GAUZE SPONGE 4X4 12PLY STRL (GAUZE/BANDAGES/DRESSINGS) ×2 IMPLANT
GAUZE XEROFORM 1X8 LF (GAUZE/BANDAGES/DRESSINGS) ×2 IMPLANT
GLOVE SURG 9.0 ORTHO LTXF (GLOVE) ×4 IMPLANT
GLOVE SURG UNDER POLY LF SZ9 (GLOVE) ×2 IMPLANT
GOWN STRL REUS TWL 2XL XL LVL4 (GOWN DISPOSABLE) ×2 IMPLANT
GOWN STRL REUS W/ TWL LRG LVL3 (GOWN DISPOSABLE) ×1 IMPLANT
GOWN STRL REUS W/TWL LRG LVL3 (GOWN DISPOSABLE) ×2
KIT TURNOVER KIT A (KITS) ×2 IMPLANT
MANIFOLD NEPTUNE II (INSTRUMENTS) ×2 IMPLANT
NEEDLE FILTER BLUNT 18X 1/2SAF (NEEDLE) ×1
NEEDLE FILTER BLUNT 18X1 1/2 (NEEDLE) ×1 IMPLANT
NS IRRIG 500ML POUR BTL (IV SOLUTION) ×2 IMPLANT
PACK EXTREMITY ARMC (MISCELLANEOUS) ×2 IMPLANT
PAD CAST CTTN 4X4 STRL (SOFTGOODS) ×2 IMPLANT
PADDING CAST 4IN STRL (MISCELLANEOUS) ×3
PADDING CAST BLEND 4X4 STRL (MISCELLANEOUS) ×3 IMPLANT
PADDING CAST COTTON 4X4 STRL (SOFTGOODS) ×4
SLING ARM LRG DEEP (SOFTGOODS) ×2 IMPLANT
SLING ARM M TX990204 (SOFTGOODS) IMPLANT
SPLINT CAST 1 STEP 3X12 (MISCELLANEOUS) ×2 IMPLANT
STOCKINETTE STRL 4IN 9604848 (GAUZE/BANDAGES/DRESSINGS) ×2 IMPLANT
STRIP CLOSURE SKIN 1/2X4 (GAUZE/BANDAGES/DRESSINGS) ×2 IMPLANT
SUT ETHILON 3-0 FS-10 30 BLK (SUTURE)
SUT ETHILON 4-0 (SUTURE)
SUT ETHILON 4-0 FS2 18XMFL BLK (SUTURE)
SUT ETHILON 5-0 FS-2 18 BLK (SUTURE) ×2 IMPLANT
SUTURE EHLN 3-0 FS-10 30 BLK (SUTURE) IMPLANT
SUTURE ETHLN 4-0 FS2 18XMF BLK (SUTURE) IMPLANT
SYR 3ML LL SCALE MARK (SYRINGE) IMPLANT

## 2021-02-02 NOTE — Anesthesia Postprocedure Evaluation (Signed)
Anesthesia Post Note  Patient: Tyrone Bird  Procedure(s) Performed: RIGHT CARPAL TUNNEL RELEASE (Right )  Patient location during evaluation: PACU Anesthesia Type: General Level of consciousness: awake and alert Pain management: pain level controlled Vital Signs Assessment: post-procedure vital signs reviewed and stable Respiratory status: spontaneous breathing and respiratory function stable Cardiovascular status: stable Anesthetic complications: no   No complications documented.   Last Vitals:  Vitals:   02/02/21 1415 02/02/21 1430  BP: (!) 148/77 138/69  Pulse: 74 64  Resp: 16 14  Temp:    SpO2: 97% 97%    Last Pain:  Vitals:   02/02/21 1430  TempSrc:   PainSc: 4                  Yaniv Lage K

## 2021-02-02 NOTE — Discharge Instructions (Addendum)
AMBULATORY SURGERY  °DISCHARGE INSTRUCTIONS ° ° °1) The drugs that you were given will stay in your system until tomorrow so for the next 24 hours you should not: ° °A) Drive an automobile °B) Make any legal decisions °C) Drink any alcoholic beverage ° ° °2) You may resume regular meals tomorrow.  Today it is better to start with liquids and gradually work up to solid foods. ° °You may eat anything you prefer, but it is better to start with liquids, then soup and crackers, and gradually work up to solid foods. ° ° °3) Please notify your doctor immediately if you have any unusual bleeding, trouble breathing, redness and pain at the surgery site, drainage, fever, or pain not relieved by medication. ° ° ° °4) Additional Instructions: ° °Hospital main number  [phone] °

## 2021-02-02 NOTE — Anesthesia Procedure Notes (Signed)
Procedure Name: LMA Insertion Date/Time: 02/02/2021 12:56 PM Performed by: Ginger Carne, CRNA Pre-anesthesia Checklist: Patient identified, Emergency Drugs available, Suction available, Patient being monitored and Timeout performed Patient Re-evaluated:Patient Re-evaluated prior to induction Oxygen Delivery Method: Circle system utilized Preoxygenation: Pre-oxygenation with 100% oxygen Induction Type: IV induction LMA: LMA inserted LMA Size: 4.5 Tube type: Oral Number of attempts: 1 Placement Confirmation: positive ETCO2 and breath sounds checked- equal and bilateral Tube secured with: Tape Dental Injury: Teeth and Oropharynx as per pre-operative assessment

## 2021-02-02 NOTE — Anesthesia Preprocedure Evaluation (Signed)
Anesthesia Evaluation  Patient identified by MRN, date of birth, ID band Patient awake    Reviewed: Allergy & Precautions, NPO status , Patient's Chart, lab work & pertinent test results  History of Anesthesia Complications Negative for: history of anesthetic complications  Airway Mallampati: II       Dental   Pulmonary neg sleep apnea, neg COPD, Not current smoker, former smoker,           Cardiovascular hypertension, Pt. on medications (-) Past MI and (-) CHF (-) dysrhythmias (-) Valvular Problems/Murmurs     Neuro/Psych neg Seizures    GI/Hepatic Neg liver ROS, neg GERD  ,  Endo/Other  Type 2, Oral Hypoglycemic Agents  Renal/GU negative Renal ROS     Musculoskeletal   Abdominal   Peds  Hematology   Anesthesia Other Findings   Reproductive/Obstetrics                            Anesthesia Physical Anesthesia Plan  ASA: III  Anesthesia Plan: General   Post-op Pain Management:    Induction: Intravenous  PONV Risk Score and Plan: 2 and Ondansetron and Dexamethasone  Airway Management Planned: LMA  Additional Equipment:   Intra-op Plan:   Post-operative Plan:   Informed Consent: I have reviewed the patients History and Physical, chart, labs and discussed the procedure including the risks, benefits and alternatives for the proposed anesthesia with the patient or authorized representative who has indicated his/her understanding and acceptance.       Plan Discussed with:   Anesthesia Plan Comments:         Anesthesia Quick Evaluation

## 2021-02-02 NOTE — H&P (Signed)
PREOPERATIVE H&P  Chief Complaint: Right Carpal Tunnel Syndrome  HPI: Tyrone Bird is a 75 y.o. male who presents for preoperative history and physical with a diagnosis of Right Carpal Tunnel Syndrome. Symptoms of pain, numbness and weakness are significantly impairing activities of daily living.  He had a preoperative EMG and nerve conduction study which demonstrated moderate carpal tunnel syndrome in both upper extremities.  His right side was symptomatic.  Patient was complaining of weakness leading to him dropping small objects and losing fine motor skills in the right hand.  The decision was therefore made to proceed with an open carpal tunnel release. He has agreed with surgical treatment.   Past Medical History:  Diagnosis Date  . Arthritis   . Diabetes mellitus without complication (HCC)   . Elevated lipids   . Environmental and seasonal allergies   . History of kidney stones   . Hypertension   . Pneumonia    Past Surgical History:  Procedure Laterality Date  . COLONOSCOPY    . KNEE ARTHROSCOPY    . TOTAL KNEE ARTHROPLASTY Right 02/08/2018   Procedure: TOTAL KNEE ARTHROPLASTY;  Surgeon: Christena Flake, MD;  Location: [facility] ORS;  Service: Orthopedics;  Laterality: Right;   Social History   Socioeconomic History  . Marital status: Married    Spouse name: Not on file  . Number of children: Not on file  . Years of education: Not on file  . Highest education level: Not on file  Occupational History  . Not on file  Tobacco Use  . Smoking status: Former Smoker    Quit date: 01/31/1998    Years since quitting: 23.0  . Smokeless tobacco: Never Used  Vaping Use  . Vaping Use: Never used  Substance and Sexual Activity  . Alcohol use: Not Currently    Comment: rare  . Drug use: No  . Sexual activity: Not on file  Other Topics Concern  . Not on file  Social History Narrative  . Not on file   Social Determinants of Health   Financial Resource Strain: Not on file  Food  Insecurity: Not on file  Transportation Needs: Not on file  Physical Activity: Not on file  Stress: Not on file  Social Connections: Not on file   History reviewed. No pertinent family history. Allergies  Allergen Reactions  . Sulfa Antibiotics Other (See Comments)    Childhood allergy   Prior to Admission medications   Medication Sig Start Date End Date Taking? Authorizing Provider  atorvastatin (LIPITOR) 80 MG tablet Take 80 mg by mouth daily.   Yes [provider]  diphenhydramine-acetaminophen (TYLENOL PM) 25-500 MG TABS tablet Take 1 tablet by mouth at bedtime as needed.   Yes [provider]  furosemide (LASIX) 40 MG tablet Take 40 mg by mouth daily as needed for edema. 05/17/18  Yes [provider]  ibuprofen (ADVIL) 200 MG tablet Take 200 mg by mouth every 6 (six) hours as needed for headache or moderate pain.   Yes [provider]  metFORMIN (GLUCOPHAGE) 500 MG tablet Take 500 mg by mouth daily.   Yes [provider]  Multiple Vitamin (MULTIVITAMIN WITH MINERALS) TABS tablet Take 1 tablet by mouth daily.   Yes [provider]  Multiple Vitamins-Minerals (HEALTHY EYES) TABS Take 1 tablet by mouth daily.   Yes [provider]  potassium chloride (KLOR-CON) 10 MEQ tablet Take 10 mEq by mouth daily as needed (when taking lasix).   Yes [provider]     Positive ROS: All other systems have been reviewed and were otherwise negative with the exception of those mentioned in the HPI and as above.  Physical Exam: General: Alert, no acute distress Cardiovascular: Regular rate and rhythm, no murmurs rubs or gallops.  No pedal edema Respiratory: Clear to auscultation bilaterally, no wheezes rales or rhonchi. No cyanosis, no use of accessory musculature GI: No organomegaly, abdomen is soft and non-tender nondistended with positive bowel sounds. Skin: Skin intact, no lesions within the operative field. Neurologic:  Sensation intact distally Psychiatric: Patient is competent for consent with normal mood and affect Lymphatic: No cervical lymphadenopathy  MUSCULOSKELETAL: Right hand: Patient skin is intact and there is no erythema ecchymosis or swelling.  He has reduced sensation to light touch in his thumb index and middle finger.  He has no obvious thenar atrophy.  He does not demonstrate significant weakness to pinch strength.  Patient's fingers are well-perfused.  He has a palpable radial pulse.  He has no tenderness to palpation.  Assessment: Right Carpal Tunnel Syndrome  Plan: Plan for Procedure(s): RIGHT OPEN CARPAL TUNNEL RELEASE  I reviewed the details of the operation as well as the postoperative course with the patient.  I discussed the risks and benefits of surgery. The risks include but are not limited to infection, bleeding, nerve or blood vessel injury, joint stiffness or loss of motion, persistent pain, weakness or instability, persistent or recurrent carpal tunnel syndrome and the need for further surgery. Medical risks include but are not limited to DVT and pulmonary embolism, myocardial infarction, stroke, pneumonia, respiratory failure and death. Patient understood these risks and wished to proceed.     [name], MD   02/02/2021 12:45 PM

## 2021-02-02 NOTE — Op Note (Signed)
  02/02/2021  2:06 PM  PATIENT:  Tyrone Bird    PRE-OPERATIVE DIAGNOSIS:  Right Carpal Tunnel Syndrome  POST-OPERATIVE DIAGNOSIS:  Same  PROCEDURE:  RIGHT OPEN CARPAL TUNNEL RELEASE  SURGEON:  Thornton Park, MD  ANESTHESIA:   General  PREOPERATIVE INDICATIONS:  Tyrone Bird is a  75 y.o. male with a diagnosis of Right Carpal Tunnel Syndrome who failed conservative measures and elected for surgical management.    I discussed the risks and benefits of surgery. The risks include but are not limited to infection, bleeding, nerve or blood vessel injury, joint stiffness or loss of motion, persistent pain, weakness, recurrence of symptoms and the need for further surgery. Medical risks include but are not limited to DVT and pulmonary embolism, myocardial infarction, stroke, pneumonia, respiratory failure and death. Patient understood these risks and wished to proceed.   OPERATIVE FINDINGS: Significant median nerve compression at the carpal tunnel, right upper extremity  OPERATIVE PROCEDURE: Patient was met in the preoperative area. I signed the right wrist with my initials and the word yes according the hospital's correct site of surgery protocol. I answered all the patient's questions. He was then brought to the operating room where he underwent general anesthesia.  He was positioned supine on the operative table. The right arm was placed on a hand table. A tourniquet was applied to the right upper extremity. He was prepped and draped in a sterile fashion. A timeout was performed to verify the patient's name, date of birth, medical record number, correct site of surgery correct procedure to be performed. The time out was also used to confirm the patient received antibiotics that all necessary instruments were available in the room. The right upper extremity was then exsanguinated with an Esmarch and the tourniquet inflated to 250 mmHg for 25 minutes.  An incision following the palmar crease was  made. This was made in line with the web space between the middle and ring fingers and the distal extent of the incision was where it intersected Kaplan's cardinal line. Bleeding vessels were cauterized with a bipolar.  The subcutaneous tissue was carefully dissected out with a tenotomy scissors and pickup until the palmar fascia was encountered. The distal extent of the transverse carpal ligament was then identified. A Freer elevator was placed under the transverse carpal ligament running distally to proximally. A micro-Beaver blade was then used to incise the transverse carpal ligament taking care to avoid injury to any neurovascular structures. The carpal tunnel was found to be extremely constricted. There was significant compression on the median nerve. The transverse carpal ligament was completely released. The nerve was visualized in its entirety and the carpal tunnel. The wound was copiously irrigated. The skin was then approximated with 5-0 nylon. Xeroform was placed over the incision. A dry sterile dressing was applied along with a volar fiberglass splint. Patient was overwrapped with an Ace wrap. The tourniquet was deflated at 25 minutes.  Sling was placed on the right upper extremity. He was extubated and brought to the PACU in stable condition. I was scrubbed and present the entire case and all sharp and instrument counts were correct at the conclusion the case.    Timoteo Gaul, MD

## 2021-02-02 NOTE — Transfer of Care (Signed)
Immediate Anesthesia Transfer of Care Note  Patient: Tyrone Bird  Procedure(s) Performed: RIGHT CARPAL TUNNEL RELEASE (Right )  Patient Location: PACU  Anesthesia Type:General  Level of Consciousness: awake and alert   Airway & Oxygen Therapy: Patient Spontanous Breathing and Patient connected to face mask oxygen  Post-op Assessment: Report given to RN and Post -op Vital signs reviewed and stable  Post vital signs: Reviewed and stable  Last Vitals:  Vitals Value Taken Time  BP 140/78 02/02/21 1355  Temp    Pulse 73 02/02/21 1355  Resp 11 02/02/21 1355  SpO2 100 % 02/02/21 1355  Vitals shown include unvalidated device data.  Last Pain:  Vitals:   02/02/21 1130  TempSrc: Temporal  PainSc: 0-No pain         Complications: No complications documented.

## 2021-02-03 ENCOUNTER — Encounter: Payer: Self-pay | Admitting: Orthopedic Surgery

## 2021-02-10 DIAGNOSIS — G5601 Carpal tunnel syndrome, right upper limb: Secondary | ICD-10-CM | POA: Diagnosis not present

## 2021-02-19 DIAGNOSIS — S62616D Displaced fracture of proximal phalanx of right little finger, subsequent encounter for fracture with routine healing: Secondary | ICD-10-CM | POA: Diagnosis not present

## 2021-02-23 DIAGNOSIS — Z20822 Contact with and (suspected) exposure to covid-19: Secondary | ICD-10-CM | POA: Diagnosis not present

## 2021-03-02 DIAGNOSIS — G5601 Carpal tunnel syndrome, right upper limb: Secondary | ICD-10-CM | POA: Diagnosis not present

## 2021-07-26 DIAGNOSIS — E1169 Type 2 diabetes mellitus with other specified complication: Secondary | ICD-10-CM | POA: Diagnosis not present

## 2021-07-26 DIAGNOSIS — E785 Hyperlipidemia, unspecified: Secondary | ICD-10-CM | POA: Diagnosis not present

## 2021-08-02 DIAGNOSIS — I779 Disorder of arteries and arterioles, unspecified: Secondary | ICD-10-CM | POA: Diagnosis not present

## 2021-08-02 DIAGNOSIS — E785 Hyperlipidemia, unspecified: Secondary | ICD-10-CM | POA: Diagnosis not present

## 2021-08-02 DIAGNOSIS — I10 Essential (primary) hypertension: Secondary | ICD-10-CM | POA: Diagnosis not present

## 2021-08-02 DIAGNOSIS — E1169 Type 2 diabetes mellitus with other specified complication: Secondary | ICD-10-CM | POA: Diagnosis not present

## 2021-11-10 IMAGING — DX DG FINGER LITTLE 2+V*R*
3 series · 3 of 3 positions shown · non-contrast
Comparison: Same day radiographs

CLINICAL DATA: Post reduction

EXAM:
RIGHT LITTLE FINGER 2+V

[finger ap]
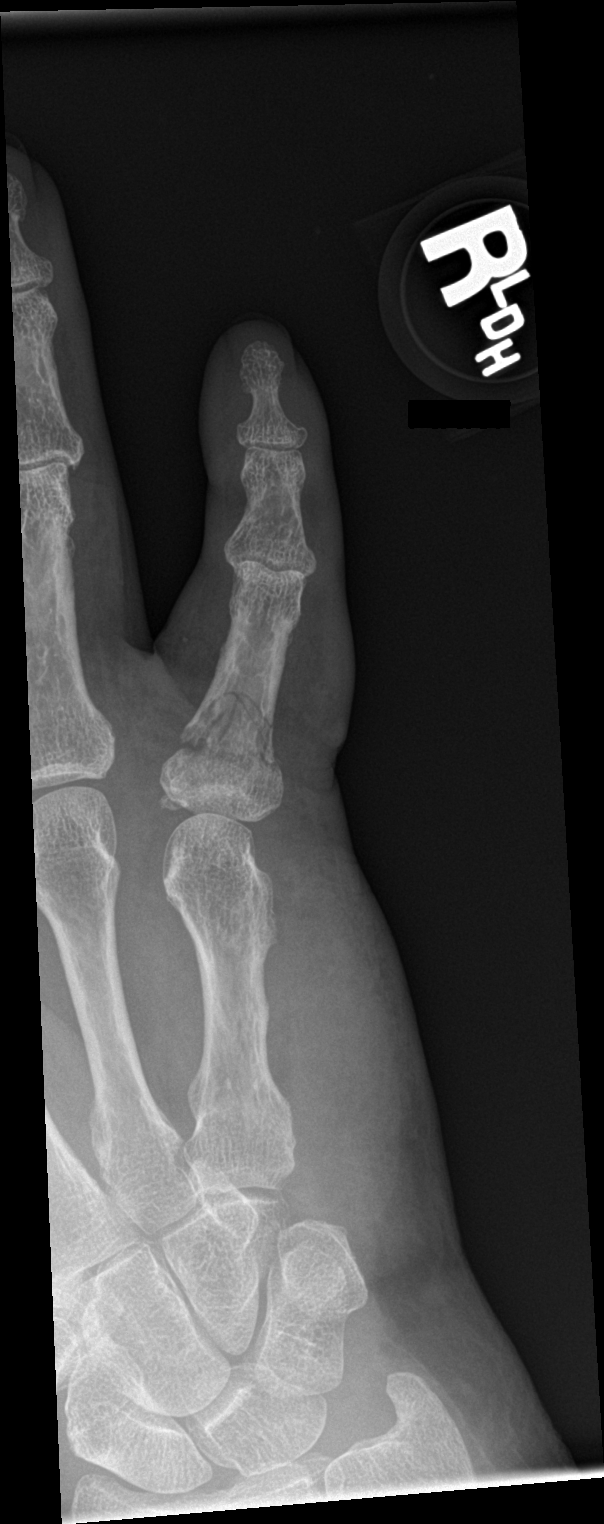

[finger obl]
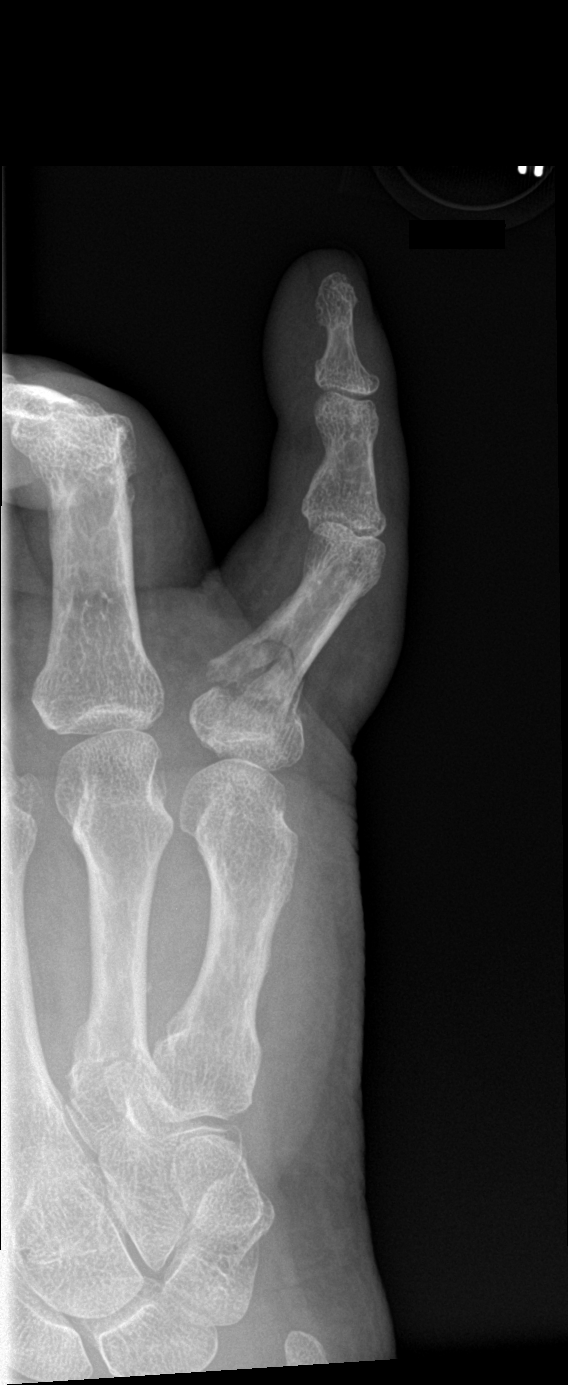

[finger lat]
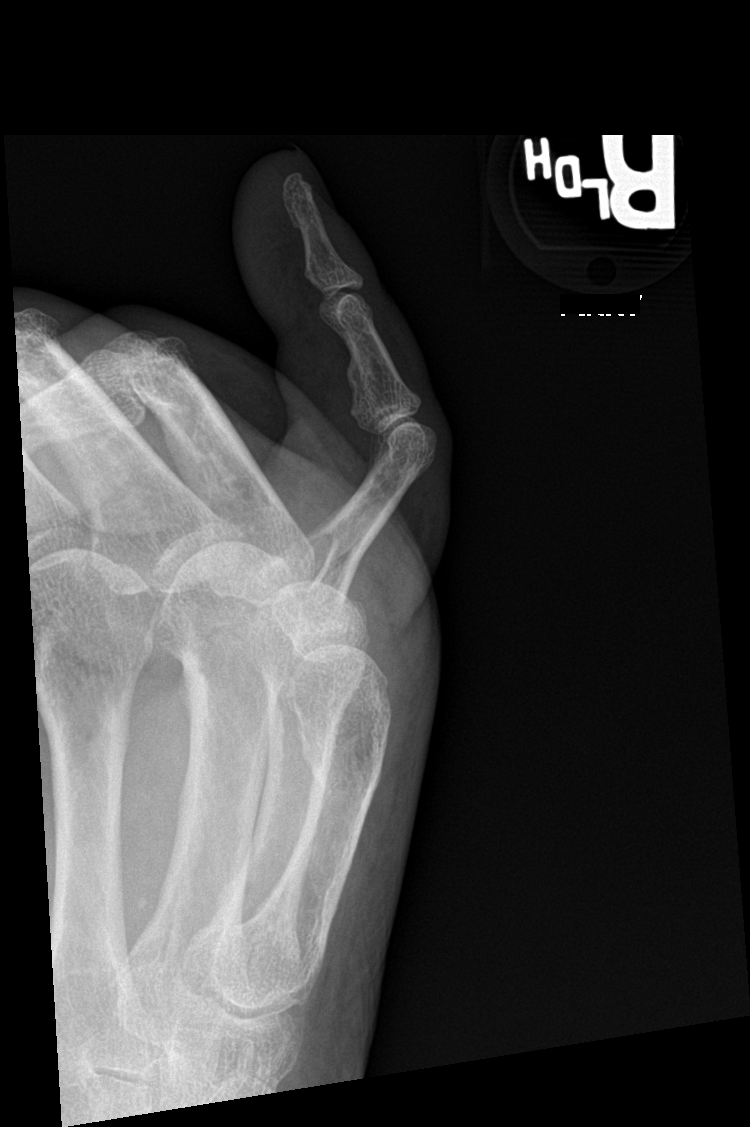

[3 of 3 positions shown; findings below may reference images not displayed]

FINDINGS: Post reduction imaging of the fifth digit redemonstrates the
comminuted fracture at the proximal metadiaphysis of the fifth
proximal phalanx shows minimally improved alignment with persistent
volar displacement and dorsal angulation across the fracture line.
Intra-articular extension is less convincingly demonstrated on this
exam. Surrounding soft tissue swelling is present. No additional
acute fractures are evident. Remote posttraumatic deformity of the
fifth metacarpal is again seen.
IMPRESSION: 1. Minimally improved alignment across the comminuted fracture
involving the proximal metadiaphysis of fifth proximal phalanx.
2. Intra-articular extension is less convincingly demonstrated on
this exam.

## 2022-02-02 DIAGNOSIS — I10 Essential (primary) hypertension: Secondary | ICD-10-CM | POA: Diagnosis not present

## 2022-02-02 DIAGNOSIS — E785 Hyperlipidemia, unspecified: Secondary | ICD-10-CM | POA: Diagnosis not present

## 2022-02-02 DIAGNOSIS — E1169 Type 2 diabetes mellitus with other specified complication: Secondary | ICD-10-CM | POA: Diagnosis not present

## 2022-02-09 DIAGNOSIS — I779 Disorder of arteries and arterioles, unspecified: Secondary | ICD-10-CM | POA: Diagnosis not present

## 2022-02-09 DIAGNOSIS — E785 Hyperlipidemia, unspecified: Secondary | ICD-10-CM | POA: Diagnosis not present

## 2022-02-09 DIAGNOSIS — Z0000 Encounter for general adult medical examination without abnormal findings: Secondary | ICD-10-CM | POA: Diagnosis not present

## 2022-02-09 DIAGNOSIS — I10 Essential (primary) hypertension: Secondary | ICD-10-CM | POA: Diagnosis not present

## 2022-02-09 DIAGNOSIS — E1169 Type 2 diabetes mellitus with other specified complication: Secondary | ICD-10-CM | POA: Diagnosis not present

## 2022-02-09 DIAGNOSIS — Z1389 Encounter for screening for other disorder: Secondary | ICD-10-CM | POA: Diagnosis not present

## 2022-08-02 DIAGNOSIS — E1169 Type 2 diabetes mellitus with other specified complication: Secondary | ICD-10-CM | POA: Diagnosis not present

## 2022-08-02 DIAGNOSIS — I10 Essential (primary) hypertension: Secondary | ICD-10-CM | POA: Diagnosis not present

## 2022-08-02 DIAGNOSIS — E785 Hyperlipidemia, unspecified: Secondary | ICD-10-CM | POA: Diagnosis not present

## 2022-08-09 DIAGNOSIS — I779 Disorder of arteries and arterioles, unspecified: Secondary | ICD-10-CM | POA: Diagnosis not present

## 2022-08-09 DIAGNOSIS — E1169 Type 2 diabetes mellitus with other specified complication: Secondary | ICD-10-CM | POA: Diagnosis not present

## 2022-08-09 DIAGNOSIS — I10 Essential (primary) hypertension: Secondary | ICD-10-CM | POA: Diagnosis not present

## 2022-08-09 DIAGNOSIS — E785 Hyperlipidemia, unspecified: Secondary | ICD-10-CM | POA: Diagnosis not present

## 2022-11-24 DIAGNOSIS — H5203 Hypermetropia, bilateral: Secondary | ICD-10-CM | POA: Diagnosis not present

## 2022-11-24 DIAGNOSIS — H524 Presbyopia: Secondary | ICD-10-CM | POA: Diagnosis not present

## 2023-02-09 DIAGNOSIS — I10 Essential (primary) hypertension: Secondary | ICD-10-CM | POA: Diagnosis not present

## 2023-02-09 DIAGNOSIS — E785 Hyperlipidemia, unspecified: Secondary | ICD-10-CM | POA: Diagnosis not present

## 2023-02-09 DIAGNOSIS — E1169 Type 2 diabetes mellitus with other specified complication: Secondary | ICD-10-CM | POA: Diagnosis not present

## 2023-02-16 DIAGNOSIS — I779 Disorder of arteries and arterioles, unspecified: Secondary | ICD-10-CM | POA: Diagnosis not present

## 2023-02-16 DIAGNOSIS — E785 Hyperlipidemia, unspecified: Secondary | ICD-10-CM | POA: Diagnosis not present

## 2023-02-16 DIAGNOSIS — G5603 Carpal tunnel syndrome, bilateral upper limbs: Secondary | ICD-10-CM | POA: Diagnosis not present

## 2023-02-16 DIAGNOSIS — E118 Type 2 diabetes mellitus with unspecified complications: Secondary | ICD-10-CM | POA: Diagnosis not present

## 2023-02-16 DIAGNOSIS — Z0000 Encounter for general adult medical examination without abnormal findings: Secondary | ICD-10-CM | POA: Diagnosis not present

## 2023-02-16 DIAGNOSIS — I10 Essential (primary) hypertension: Secondary | ICD-10-CM | POA: Diagnosis not present

## 2023-02-16 DIAGNOSIS — Z1211 Encounter for screening for malignant neoplasm of colon: Secondary | ICD-10-CM | POA: Diagnosis not present

## 2023-02-16 DIAGNOSIS — E1169 Type 2 diabetes mellitus with other specified complication: Secondary | ICD-10-CM | POA: Diagnosis not present

## 2023-06-06 DIAGNOSIS — E118 Type 2 diabetes mellitus with unspecified complications: Secondary | ICD-10-CM | POA: Diagnosis not present

## 2023-06-06 DIAGNOSIS — I10 Essential (primary) hypertension: Secondary | ICD-10-CM | POA: Diagnosis not present

## 2023-06-06 DIAGNOSIS — Z0000 Encounter for general adult medical examination without abnormal findings: Secondary | ICD-10-CM | POA: Diagnosis not present

## 2023-06-12 DIAGNOSIS — I10 Essential (primary) hypertension: Secondary | ICD-10-CM | POA: Diagnosis not present

## 2023-06-12 DIAGNOSIS — E118 Type 2 diabetes mellitus with unspecified complications: Secondary | ICD-10-CM | POA: Diagnosis not present

## 2023-06-12 DIAGNOSIS — E785 Hyperlipidemia, unspecified: Secondary | ICD-10-CM | POA: Diagnosis not present

## 2023-06-12 DIAGNOSIS — I779 Disorder of arteries and arterioles, unspecified: Secondary | ICD-10-CM | POA: Diagnosis not present

## 2023-06-12 DIAGNOSIS — E1169 Type 2 diabetes mellitus with other specified complication: Secondary | ICD-10-CM | POA: Diagnosis not present

## 2023-09-07 DIAGNOSIS — Z1211 Encounter for screening for malignant neoplasm of colon: Secondary | ICD-10-CM | POA: Diagnosis not present

## 2023-09-13 LAB — EXTERNAL GENERIC LAB PROCEDURE: COLOGUARD: POSITIVE — AB

## 2023-10-05 DIAGNOSIS — E118 Type 2 diabetes mellitus with unspecified complications: Secondary | ICD-10-CM | POA: Diagnosis not present

## 2023-10-05 DIAGNOSIS — I779 Disorder of arteries and arterioles, unspecified: Secondary | ICD-10-CM | POA: Diagnosis not present

## 2023-10-05 DIAGNOSIS — I10 Essential (primary) hypertension: Secondary | ICD-10-CM | POA: Diagnosis not present

## 2023-10-12 DIAGNOSIS — E118 Type 2 diabetes mellitus with unspecified complications: Secondary | ICD-10-CM | POA: Diagnosis not present

## 2023-10-12 DIAGNOSIS — I10 Essential (primary) hypertension: Secondary | ICD-10-CM | POA: Diagnosis not present

## 2023-10-12 DIAGNOSIS — Z1211 Encounter for screening for malignant neoplasm of colon: Secondary | ICD-10-CM | POA: Diagnosis not present

## 2023-10-12 DIAGNOSIS — Z711 Person with feared health complaint in whom no diagnosis is made: Secondary | ICD-10-CM | POA: Diagnosis not present

## 2023-10-12 DIAGNOSIS — E1169 Type 2 diabetes mellitus with other specified complication: Secondary | ICD-10-CM | POA: Diagnosis not present

## 2023-10-12 DIAGNOSIS — E785 Hyperlipidemia, unspecified: Secondary | ICD-10-CM | POA: Diagnosis not present

## 2023-10-12 DIAGNOSIS — E538 Deficiency of other specified B group vitamins: Secondary | ICD-10-CM | POA: Diagnosis not present

## 2023-10-12 DIAGNOSIS — I779 Disorder of arteries and arterioles, unspecified: Secondary | ICD-10-CM | POA: Diagnosis not present

## 2023-10-19 DIAGNOSIS — E538 Deficiency of other specified B group vitamins: Secondary | ICD-10-CM | POA: Diagnosis not present

## 2023-11-28 DIAGNOSIS — E538 Deficiency of other specified B group vitamins: Secondary | ICD-10-CM | POA: Diagnosis not present

## 2024-02-12 DIAGNOSIS — R195 Other fecal abnormalities: Secondary | ICD-10-CM | POA: Diagnosis not present

## 2024-03-21 DIAGNOSIS — I10 Essential (primary) hypertension: Secondary | ICD-10-CM | POA: Diagnosis not present

## 2024-03-21 DIAGNOSIS — E1169 Type 2 diabetes mellitus with other specified complication: Secondary | ICD-10-CM | POA: Diagnosis not present

## 2024-03-21 DIAGNOSIS — E118 Type 2 diabetes mellitus with unspecified complications: Secondary | ICD-10-CM | POA: Diagnosis not present

## 2024-03-21 DIAGNOSIS — E785 Hyperlipidemia, unspecified: Secondary | ICD-10-CM | POA: Diagnosis not present

## 2024-03-22 ENCOUNTER — Encounter: Payer: Self-pay | Admitting: Gastroenterology

## 2024-03-28 DIAGNOSIS — E118 Type 2 diabetes mellitus with unspecified complications: Secondary | ICD-10-CM | POA: Diagnosis not present

## 2024-03-28 DIAGNOSIS — I10 Essential (primary) hypertension: Secondary | ICD-10-CM | POA: Diagnosis not present

## 2024-03-28 DIAGNOSIS — I779 Disorder of arteries and arterioles, unspecified: Secondary | ICD-10-CM | POA: Diagnosis not present

## 2024-03-28 DIAGNOSIS — Z0000 Encounter for general adult medical examination without abnormal findings: Secondary | ICD-10-CM | POA: Diagnosis not present

## 2024-03-28 DIAGNOSIS — E785 Hyperlipidemia, unspecified: Secondary | ICD-10-CM | POA: Diagnosis not present

## 2024-03-28 DIAGNOSIS — E1169 Type 2 diabetes mellitus with other specified complication: Secondary | ICD-10-CM | POA: Diagnosis not present

## 2024-04-04 ENCOUNTER — Ambulatory Visit: Admitting: General Practice

## 2024-04-04 ENCOUNTER — Other Ambulatory Visit: Payer: Self-pay

## 2024-04-04 ENCOUNTER — Encounter
Admission: RE | Disposition: A | Discharge status: Home / Self Care | Payer: Self-pay | Source: Home / Self Care | Attending: Gastroenterology

## 2024-04-04 ENCOUNTER — Ambulatory Visit
Admission: RE | Admit: 2024-04-04 | Discharge: 2024-04-04 | Disposition: A | Discharge status: Home / Self Care | Payer: Medicare HMO | Attending: Gastroenterology | Admitting: Gastroenterology

## 2024-04-04 ENCOUNTER — Encounter: Payer: Self-pay | Admitting: Gastroenterology

## 2024-04-04 DIAGNOSIS — D122 Benign neoplasm of ascending colon: Secondary | ICD-10-CM | POA: Diagnosis not present

## 2024-04-04 DIAGNOSIS — K5730 Diverticulosis of large intestine without perforation or abscess without bleeding: Secondary | ICD-10-CM | POA: Diagnosis not present

## 2024-04-04 DIAGNOSIS — Z7984 Long term (current) use of oral hypoglycemic drugs: Secondary | ICD-10-CM | POA: Diagnosis not present

## 2024-04-04 DIAGNOSIS — Z87891 Personal history of nicotine dependence: Secondary | ICD-10-CM | POA: Diagnosis not present

## 2024-04-04 DIAGNOSIS — D125 Benign neoplasm of sigmoid colon: Secondary | ICD-10-CM | POA: Insufficient documentation

## 2024-04-04 DIAGNOSIS — Z1211 Encounter for screening for malignant neoplasm of colon: Secondary | ICD-10-CM | POA: Insufficient documentation

## 2024-04-04 DIAGNOSIS — I10 Essential (primary) hypertension: Secondary | ICD-10-CM | POA: Insufficient documentation

## 2024-04-04 DIAGNOSIS — E119 Type 2 diabetes mellitus without complications: Secondary | ICD-10-CM | POA: Diagnosis not present

## 2024-04-04 DIAGNOSIS — K621 Rectal polyp: Secondary | ICD-10-CM | POA: Diagnosis not present

## 2024-04-04 DIAGNOSIS — K635 Polyp of colon: Secondary | ICD-10-CM | POA: Diagnosis not present

## 2024-04-04 DIAGNOSIS — D123 Benign neoplasm of transverse colon: Secondary | ICD-10-CM | POA: Diagnosis not present

## 2024-04-04 DIAGNOSIS — K640 First degree hemorrhoids: Secondary | ICD-10-CM | POA: Insufficient documentation

## 2024-04-04 DIAGNOSIS — R195 Other fecal abnormalities: Secondary | ICD-10-CM | POA: Diagnosis not present

## 2024-04-04 DIAGNOSIS — K649 Unspecified hemorrhoids: Secondary | ICD-10-CM | POA: Diagnosis not present

## 2024-04-04 HISTORY — DX: Disorder of arteries and arterioles, unspecified: I77.9

## 2024-04-04 HISTORY — PX: POLYPECTOMY: SHX149

## 2024-04-04 HISTORY — DX: Other spondylosis with radiculopathy, cervical region: M47.22

## 2024-04-04 HISTORY — PX: COLONOSCOPY WITH PROPOFOL: SHX5780

## 2024-04-04 LAB — GLUCOSE, CAPILLARY: Glucose-Capillary: 148 mg/dL — ABNORMAL HIGH (ref 70–99)

## 2024-04-04 SURGERY — COLONOSCOPY WITH PROPOFOL
Anesthesia: General

## 2024-04-04 MED ORDER — PROPOFOL 500 MG/50ML IV EMUL
INTRAVENOUS | Status: DC | PRN
  Administered 2024-04-04: 75 ug/kg/min via INTRAVENOUS

## 2024-04-04 MED ORDER — LIDOCAINE HCL (PF) 2 % IJ SOLN
INTRAMUSCULAR | Status: AC
  Filled 2024-04-04: qty 5

## 2024-04-04 MED ORDER — PHENYLEPHRINE 80 MCG/ML (10ML) SYRINGE FOR IV PUSH (FOR BLOOD PRESSURE SUPPORT)
PREFILLED_SYRINGE | INTRAVENOUS | Status: DC | PRN
  Administered 2024-04-04: 80 ug via INTRAVENOUS
  Administered 2024-04-04: 240 ug via INTRAVENOUS

## 2024-04-04 MED ORDER — SODIUM CHLORIDE 0.9 % IV SOLN: INTRAVENOUS | Status: DC

## 2024-04-04 MED ORDER — LIDOCAINE HCL (CARDIAC) PF 100 MG/5ML IV SOSY
PREFILLED_SYRINGE | INTRAVENOUS | Status: DC | PRN
  Administered 2024-04-04: 80 mg via INTRAVENOUS

## 2024-04-04 MED ORDER — PROPOFOL 10 MG/ML IV BOLUS
INTRAVENOUS | Status: DC | PRN
  Administered 2024-04-04 (×2): 20 mg via INTRAVENOUS
  Administered 2024-04-04: 40 mg via INTRAVENOUS
  Administered 2024-04-04: 20 mg via INTRAVENOUS

## 2024-04-04 MED ORDER — DEXMEDETOMIDINE HCL IN NACL 80 MCG/20ML IV SOLN
INTRAVENOUS | Status: DC | PRN
  Administered 2024-04-04: 8 ug via INTRAVENOUS
  Administered 2024-04-04: 12 ug via INTRAVENOUS

## 2024-04-04 NOTE — H&P (Signed)
 Pre-Procedure H&P   Patient ID: Tyrone Bird is a 78 y.o. male.  Gastroenterology Provider: Quintin Buckle, DO  Referring Provider: Laquetta Plank, PA PCP: Delmon Moulding, MD  Date: 04/04/2024  HPI Tyrone Bird is a 78 y.o. male who presents today for Colonoscopy for Positive Cologuard .  Patient with positive Cologuard test in September 2024.  Last underwent Cologuard in 2021 which was negative.  Last colonoscopy in 2012 also reportedly negative.  No family history of colon cancer or colon polyps  Patient currently denies any GI symptoms Creatinine 1.1   Past Medical History:  Diagnosis Date   Arthritis    Carotid artery disease (HCC)    Diabetes mellitus without complication (HCC)    Elevated lipids    Environmental and seasonal allergies    History of kidney stones    Hypertension    Osteoarthritis of spine with radiculopathy, cervical region    Pneumonia     Past Surgical History:  Procedure Laterality Date   CARPAL TUNNEL RELEASE Right 02/02/2021   Procedure: RIGHT CARPAL TUNNEL RELEASE;  Surgeon: Rande Bushy, MD;  Location: [facility] OR[facility];  Service: Orthopedics;  Laterality: Right;   CATARACT EXTRACTION Right    COLONOSCOPY     EYE SURGERY     JOINT REPLACEMENT     KNEE ARTHROSCOPY     TOTAL KNEE ARTHROPLASTY Right 02/08/2018   Procedure: TOTAL KNEE ARTHROPLASTY;  Surgeon: Elner Hahn, MD;  Location: [facility] ORS;  Service: Orthopedics;  Laterality: Right;    Family History No h/o GI disease or malignancy  Review of Systems  Constitutional:  Negative for activity change, appetite change, chills, diaphoresis, fatigue, fever and unexpected weight change.  HENT:  Negative for trouble swallowing and voice change.   Respiratory:  Negative for shortness of breath and wheezing.   Cardiovascular:  Negative for chest pain, palpitations and leg swelling.  Gastrointestinal:  Negative for abdominal distention, abdominal pain, anal bleeding, blood in  stool, constipation, diarrhea, nausea and vomiting.  Musculoskeletal:  Negative for arthralgias and myalgias.  Skin:  Negative for color change and pallor.  Neurological:  Negative for dizziness, syncope and weakness.  Psychiatric/Behavioral:  Negative for confusion. The patient is not nervous/anxious.   All other systems reviewed and are negative.    Medications No current facility-administered medications on file prior to encounter.   Current Outpatient Medications on File Prior to Encounter  Medication Sig Dispense Refill   losartan-hydrochlorothiazide (HYZAAR) 100-12.5 MG tablet Take 1 tablet by mouth daily.     metFORMIN (GLUCOPHAGE) 500 MG tablet Take 500 mg by mouth daily.     Multiple Vitamin (MULTIVITAMIN WITH MINERALS) TABS tablet Take 1 tablet by mouth daily.     Multiple Vitamins-Minerals (HEALTHY EYES) TABS Take 1 tablet by mouth daily.     potassium chloride (KLOR-CON) 10 MEQ tablet Take 10 mEq by mouth daily as needed (when taking lasix).     atorvastatin (LIPITOR) 80 MG tablet Take 80 mg by mouth daily.     diphenhydramine-acetaminophen (TYLENOL PM) 25-500 MG TABS tablet Take 1 tablet by mouth at bedtime as needed.     furosemide (LASIX) 40 MG tablet Take 40 mg by mouth daily as needed for edema.  11   ibuprofen (ADVIL) 200 MG tablet Take 200 mg by mouth every 6 (six) hours as needed for headache or moderate pain.     ondansetron (ZOFRAN) 4 MG tablet Take 1 tablet (4 mg total) by mouth every 8 (  eight) hours as needed for nausea or vomiting. 30 tablet 0   oxyCODONE (OXY IR/ROXICODONE) 5 MG immediate release tablet Take 1 tablet (5 mg total) by mouth every 4 (four) hours as needed. 40 tablet 0    Pertinent medications related to GI and procedure were reviewed by me with the patient prior to the procedure   Current Facility-Administered Medications:    0.9 %  sodium chloride infusion, , Intravenous, Continuous, Quintin Buckle, DO, Last Rate: 20 mL/hr at 04/04/24  0803, Continued from Pre-op at 04/04/24 0803  sodium chloride 20 mL/hr at 04/04/24 0803       Allergies  Allergen Reactions   Sulfa Antibiotics Other (See Comments)    Childhood allergy   Allergies were reviewed by me prior to the procedure  Objective   Body mass index is 32.64 kg/m. Vitals:   04/04/24 0752  BP: 139/76  Pulse: 78  Resp: 14  Temp: 97.9 F (36.6 C)  TempSrc: Temporal  SpO2: 97%  Weight: 100.2 kg  Height: 5\' 9"  (1.753 m)     Physical Exam Vitals and nursing note reviewed.  Constitutional:      General: He is not in acute distress.    Appearance: Normal appearance. He is not ill-appearing, toxic-appearing or diaphoretic.  HENT:     Head: Normocephalic and atraumatic.     Nose: Nose normal.     Mouth/Throat:     Mouth: Mucous membranes are moist.     Pharynx: Oropharynx is clear.  Eyes:     General: No scleral icterus.    Extraocular Movements: Extraocular movements intact.  Cardiovascular:     Rate and Rhythm: Normal rate and regular rhythm.     Heart sounds: Normal heart sounds. No murmur heard.    No friction rub. No gallop.  Pulmonary:     Effort: Pulmonary effort is normal. No respiratory distress.     Breath sounds: Normal breath sounds. No wheezing, rhonchi or rales.  Abdominal:     General: Bowel sounds are normal. There is no distension.     Palpations: Abdomen is soft.     Tenderness: There is no abdominal tenderness. There is no guarding or rebound.  Musculoskeletal:     Cervical back: Neck supple.     Right lower leg: No edema.     Left lower leg: No edema.  Skin:    General: Skin is warm and dry.     Coloration: Skin is not jaundiced or pale.  Neurological:     General: No focal deficit present.     Mental Status: He is alert and oriented to person, place, and time. Mental status is at baseline.  Psychiatric:        Mood and Affect: Mood normal.        Behavior: Behavior normal.        Thought Content: Thought content  normal.        Judgment: Judgment normal.      Assessment:  Tyrone Bird is a 78 y.o. male  who presents today for Colonoscopy for Positive Cologuard .  Plan:  Colonoscopy with possible intervention today  Colonoscopy with possible biopsy, control of bleeding, polypectomy, and interventions as necessary has been discussed with the patient/patient representative. Informed consent was obtained from the patient/patient representative after explaining the indication, nature, and risks of the procedure including but not limited to death, bleeding, perforation, missed neoplasm/lesions, cardiorespiratory compromise, and reaction to medications. Opportunity for questions was given and  appropriate answers were provided. Patient/patient representative has verbalized understanding is amenable to undergoing the procedure.   Quintin Buckle, DO  [facility]  Portions of the record may have been created with voice recognition software. Occasional wrong-word or 'sound-a-like' substitutions may have occurred due to the inherent limitations of voice recognition software.  Read the chart carefully and recognize, using context, where substitutions may have occurred.

## 2024-04-04 NOTE — Anesthesia Postprocedure Evaluation (Signed)
 Anesthesia Post Note  Patient: Tyrone Bird  Procedure(s) Performed: COLONOSCOPY WITH PROPOFOL POLYPECTOMY, INTESTINE  Patient location during evaluation: Endoscopy Anesthesia Type: General Level of consciousness: awake and alert Pain management: pain level controlled Vital Signs Assessment: post-procedure vital signs reviewed and stable Respiratory status: spontaneous breathing, nonlabored ventilation, respiratory function stable and patient connected to nasal cannula oxygen Cardiovascular status: blood pressure returned to baseline and stable Postop Assessment: no apparent nausea or vomiting Anesthetic complications: no  No notable events documented.   Last Vitals:  Vitals:   04/04/24 0918 04/04/24 0919  BP: (!) 78/58 (!) 85/57  Pulse:    Resp: 16   Temp:    SpO2: 95%     Last Pain:  Vitals:   04/04/24 0918  TempSrc:   PainSc: Asleep                 Enrique Harvest

## 2024-04-04 NOTE — Anesthesia Preprocedure Evaluation (Signed)
 Anesthesia Evaluation  Patient identified by MRN, date of birth, ID band Patient awake    Reviewed: Allergy & Precautions, NPO status , Patient's Chart, lab work & pertinent test results  History of Anesthesia Complications Negative for: history of anesthetic complications  Airway Mallampati: II  TM Distance: >3 FB Neck ROM: full    Dental  (+) Chipped, Dental Advidsory Given   Pulmonary neg pulmonary ROS, neg sleep apnea, neg COPD, Not current smoker, former smoker   Pulmonary exam normal        Cardiovascular hypertension, Pt. on medications (-) Past MI and (-) CHF negative cardio ROS Normal cardiovascular exam(-) dysrhythmias (-) Valvular Problems/Murmurs     Neuro/Psych neg Seizures negative neurological ROS  negative psych ROS   GI/Hepatic negative GI ROS, Neg liver ROS,neg GERD  ,,  Endo/Other  negative endocrine ROSdiabetes, Type 2, Oral Hypoglycemic Agents    Renal/GU negative Renal ROS  negative genitourinary   Musculoskeletal   Abdominal   Peds  Hematology negative hematology ROS (+)   Anesthesia Other Findings Past Medical History: No date: Arthritis No date: Carotid artery disease (HCC) No date: Diabetes mellitus without complication (HCC) No date: Elevated lipids No date: Environmental and seasonal allergies No date: History of kidney stones No date: Hypertension No date: Osteoarthritis of spine with radiculopathy, cervical region No date: Pneumonia  Past Surgical History: 02/02/2021: CARPAL TUNNEL RELEASE; Right     Comment:  Procedure: RIGHT CARPAL TUNNEL RELEASE;  Surgeon:               [name], MD;  Location: [facility] ORS;  Service:               Orthopedics;  Laterality: Right; No date: CATARACT EXTRACTION; Right No date: COLONOSCOPY No date: EYE SURGERY No date: JOINT REPLACEMENT No date: KNEE ARTHROSCOPY 02/08/2018: TOTAL KNEE ARTHROPLASTY; Right     Comment:  Procedure: TOTAL  KNEE ARTHROPLASTY;  Surgeon: Christena Flake, MD;  Location: [facility] ORS;  Service: Orthopedics;                Laterality: Right;     Reproductive/Obstetrics negative OB ROS                             Anesthesia Physical Anesthesia Plan  ASA: 2  Anesthesia Plan: General   Post-op Pain Management: Minimal or no pain anticipated   Induction: Intravenous  PONV Risk Score and Plan: 3 and Propofol infusion, TIVA and Ondansetron  Airway Management Planned: Nasal Cannula  Additional Equipment: None  Intra-op Plan:   Post-operative Plan:   Informed Consent: I have reviewed the patients History and Physical, chart, labs and discussed the procedure including the risks, benefits and alternatives for the proposed anesthesia with the patient or authorized representative who has indicated his/her understanding and acceptance.     Dental advisory given  Plan Discussed with: CRNA and Surgeon  Anesthesia Plan Comments: (Discussed risks of anesthesia with patient, including possibility of difficulty with spontaneous ventilation under anesthesia necessitating airway intervention, PONV, and rare risks such as cardiac or respiratory or neurological events, and allergic reactions. Discussed the role of CRNA in patient's perioperative care. Patient understands.)       Anesthesia Quick Evaluation

## 2024-04-04 NOTE — Op Note (Signed)
 [facility] Gastroenterology Patient Name: Tyrone Bird Procedure Date: 04/04/2024 8:30 AM MRN: [id number] Account #: [id number] Date of Birth: Jul 20, 1946 Admit Type: Outpatient Age: 78 Room: [facility] ENDO ROOM 1 Gender: Male Note Status: Finalized Instrument Name: Colonoscope [id number] Procedure:             Colonoscopy Indications:           Positive Cologuard test Providers:             Trenda Moots, DO Referring MD:          Jaynie Collins DO, DO (Referring MD), Lauro Regulus MD, MD (Referring MD) Medicines:             Monitored Anesthesia Care Complications:         No immediate complications. Estimated blood loss:                         Minimal. Procedure:             Pre-Anesthesia Assessment:                        - Prior to the procedure, a History and Physical was                         performed, and patient medications and allergies were                         reviewed. The patient is competent. The risks and                         benefits of the procedure and the sedation options and                         risks were discussed with the patient. All questions                         were answered and informed consent was obtained.                         Patient identification and proposed procedure were                         verified by the physician, the nurse, the anesthetist                         and the technician in the endoscopy suite. Mental                         Status Examination: alert and oriented. Airway                         Examination: normal oropharyngeal airway and neck                         mobility. Respiratory Examination: clear to  auscultation. CV Examination: RRR, no murmurs, no S3                         or S4. Prophylactic Antibiotics: The patient does not                         require prophylactic antibiotics. Prior                          Anticoagulants: The patient has taken no anticoagulant                         or antiplatelet agents. ASA Grade Assessment: II - A                         patient with mild systemic disease. After reviewing                         the risks and benefits, the patient was deemed in                         satisfactory condition to undergo the procedure. The                         anesthesia plan was to use monitored anesthesia care                         (MAC). Immediately prior to administration of                         medications, the patient was re-assessed for adequacy                         to receive sedatives. The heart rate, respiratory                         rate, oxygen saturations, blood pressure, adequacy of                         pulmonary ventilation, and response to care were                         monitored throughout the procedure. The physical                         status of the patient was re-assessed after the                         procedure.                        After obtaining informed consent, the colonoscope was                         passed under direct vision. Throughout the procedure,                         the patient's blood pressure, pulse, and oxygen  saturations were monitored continuously. The                         Colonoscope was introduced through the anus and                         advanced to the the terminal ileum, with                         identification of the appendiceal orifice and IC                         valve. The colonoscopy was somewhat difficult due to                         multiple diverticula in the colon, significant looping                         and the patient's body habitus. Successful completion                         of the procedure was aided by straightening and                         shortening the scope to obtain bowel loop reduction,                         using scope torsion,  applying abdominal pressure and                         lavage. The patient tolerated the procedure well. The                         quality of the bowel preparation was evaluated using                         the BBPS [facility] Bowel Preparation Scale) with scores                         of: Right Colon = 3 (entire mucosa seen well with no                         residual staining, small fragments of stool or opaque                         liquid), Transverse Colon = 3 (entire mucosa seen well                         with no residual staining, small fragments of stool or                         opaque liquid) and Left Colon = 2 (minor amount of                         residual staining, small fragments of stool and/or  opaque liquid, but mucosa seen well). The total BBPS                         score equals 8. The quality of the bowel preparation                         was excellent. The terminal ileum, ileocecal valve,                         appendiceal orifice, and rectum were photographed. Findings:      The perianal and digital rectal examinations were normal. Pertinent       negatives include normal sphincter tone.      The terminal ileum appeared normal. Estimated blood loss: none.      Multiple small-mouthed diverticula were found in the entire colon.       Predominantly on the left. Estimated blood loss: none.      Non-bleeding internal hemorrhoids were found during retroflexion. The       hemorrhoids were Grade I (internal hemorrhoids that do not prolapse).       Estimated blood loss: none.      Two sessile polyps were found in the sigmoid colon and transverse colon.       The polyps were 1 to 2 mm in size. These polyps were removed with a       jumbo cold forceps. Resection and retrieval were complete. Estimated       blood loss was minimal.      Three sessile polyps were found in the rectum, sigmoid colon and       ascending colon. The polyps were 3 to  7 mm in size. These polyps were       removed with a cold snare. Resection and retrieval were complete.       Estimated blood loss was minimal.      The exam was otherwise without abnormality on direct and retroflexion       views. Impression:            - The examined portion of the ileum was normal.                        - Diverticulosis in the entire examined colon.                        - Non-bleeding internal hemorrhoids.                        - Two 1 to 2 mm polyps in the sigmoid colon and in the                         transverse colon, removed with a jumbo cold forceps.                         Resected and retrieved.                        - Three 3 to 7 mm polyps in the rectum, in the sigmoid                         colon and in the ascending colon, removed  with a cold                         snare. Resected and retrieved.                        - The examination was otherwise normal on direct and                         retroflexion views. Recommendation:        - Patient has a contact number available for                         emergencies. The signs and symptoms of potential                         delayed complications were discussed with the patient.                         Return to normal activities tomorrow. Written                         discharge instructions were provided to the patient.                        - Discharge patient to home.                        - Resume previous diet.                        - Continue present medications.                        - No ibuprofen, naproxen, or other non-steroidal                         anti-inflammatory drugs for 5 days after polyp removal.                        - Await pathology results.                        - Repeat colonoscopy for surveillance based on                         pathology results.                        - Return to referring physician as previously                         scheduled.                         - The findings and recommendations were discussed with                         the patient. Procedure Code(s):     --- Professional ---  40981, Colonoscopy, flexible; with removal of                         tumor(s), polyp(s), or other lesion(s) by snare                         technique                        45380, 59, Colonoscopy, flexible; with biopsy, single                         or multiple Diagnosis Code(s):     --- Professional ---                        K64.0, First degree hemorrhoids                        D12.3, Benign neoplasm of transverse colon (hepatic                         flexure or splenic flexure)                        D12.8, Benign neoplasm of rectum                        D12.5, Benign neoplasm of sigmoid colon                        D12.2, Benign neoplasm of ascending colon                        R19.5, Other fecal abnormalities                        K57.30, Diverticulosis of large intestine without                         perforation or abscess without bleeding CPT copyright 2022 American Medical Association. All rights reserved. The codes documented in this report are preliminary and upon coder review may  be revised to meet current compliance requirements. Attending Participation:      I personally performed the entire procedure. Polo Brisk, DO Quintin Buckle DO, DO 04/04/2024 9:22:04 AM This report has been signed electronically. Number of Addenda: 0 Note Initiated On: 04/04/2024 8:30 AM Scope Withdrawal Time: 0 hours 15 minutes 8 seconds  Total Procedure Duration: 0 hours 26 minutes 16 seconds  Estimated Blood Loss:  Estimated blood loss was minimal.      [facility]

## 2024-04-04 NOTE — Transfer of Care (Signed)
 Immediate Anesthesia Transfer of Care Note  Patient: Tyrone Bird  Procedure(s) Performed: COLONOSCOPY WITH PROPOFOL POLYPECTOMY, INTESTINE  Patient Location: PACU  Anesthesia Type:General  Level of Consciousness: sedated  Airway & Oxygen Therapy: Patient Spontanous Breathing  Post-op Assessment: Report given to RN and Post -op Vital signs reviewed and stable  Post vital signs: Reviewed and stable  Last Vitals:  Vitals Value Taken Time  BP 85/57 04/04/24 0919  Temp    Pulse    Resp 16 04/04/24 0918  SpO2 95 % 04/04/24 0918    Last Pain:  Vitals:   04/04/24 0918  TempSrc:   PainSc: Asleep         Complications: No notable events documented.

## 2024-04-04 NOTE — Interval H&P Note (Signed)
 History and Physical Interval Note: Preprocedure H&P from 04/04/24  was reviewed and there was no interval change after seeing and examining the patient.  Written consent was obtained from the patient after discussion of risks, benefits, and alternatives. Patient has consented to proceed with Colonoscopy with possible intervention   04/04/2024 8:32 AM  [name]  has presented today for surgery, with the diagnosis of R19.5 (ICD-10-CM) - Positive colorectal cancer screening using Cologuard test.  The various methods of treatment have been discussed with the patient and family. After consideration of risks, benefits and other options for treatment, the patient has consented to  Procedure(s) with comments: COLONOSCOPY WITH PROPOFOL (N/A) - DM as a surgical intervention.  The patient's history has been reviewed, patient examined, no change in status, stable for surgery.  I have reviewed the patient's chart and labs.  Questions were answered to the patient's satisfaction.     Tyrone Bird

## 2024-04-05 LAB — SURGICAL PATHOLOGY

## 2024-07-25 DIAGNOSIS — E118 Type 2 diabetes mellitus with unspecified complications: Secondary | ICD-10-CM | POA: Diagnosis not present

## 2024-07-25 DIAGNOSIS — E1169 Type 2 diabetes mellitus with other specified complication: Secondary | ICD-10-CM | POA: Diagnosis not present

## 2024-07-25 DIAGNOSIS — Z0000 Encounter for general adult medical examination without abnormal findings: Secondary | ICD-10-CM | POA: Diagnosis not present

## 2024-07-25 DIAGNOSIS — E785 Hyperlipidemia, unspecified: Secondary | ICD-10-CM | POA: Diagnosis not present

## 2024-07-25 DIAGNOSIS — I779 Disorder of arteries and arterioles, unspecified: Secondary | ICD-10-CM | POA: Diagnosis not present

## 2024-08-01 DIAGNOSIS — I779 Disorder of arteries and arterioles, unspecified: Secondary | ICD-10-CM | POA: Diagnosis not present

## 2024-08-01 DIAGNOSIS — I10 Essential (primary) hypertension: Secondary | ICD-10-CM | POA: Diagnosis not present

## 2024-08-01 DIAGNOSIS — E785 Hyperlipidemia, unspecified: Secondary | ICD-10-CM | POA: Diagnosis not present

## 2024-08-01 DIAGNOSIS — E1169 Type 2 diabetes mellitus with other specified complication: Secondary | ICD-10-CM | POA: Diagnosis not present

## 2024-08-01 DIAGNOSIS — E118 Type 2 diabetes mellitus with unspecified complications: Secondary | ICD-10-CM | POA: Diagnosis not present
